# Patient Record
Sex: Female | Born: 1935 | Race: White | Hispanic: No | Smoking: Never smoker
Health system: Southern US, Community
[De-identification: ages and names within clinical notes are randomized; demographics above are authoritative.]

## PROBLEM LIST (undated history)

## (undated) DIAGNOSIS — D508 Other iron deficiency anemias: Secondary | ICD-10-CM

## (undated) DIAGNOSIS — G894 Chronic pain syndrome: Secondary | ICD-10-CM

## (undated) DIAGNOSIS — I10 Essential (primary) hypertension: Secondary | ICD-10-CM

## (undated) DIAGNOSIS — K219 Gastro-esophageal reflux disease without esophagitis: Secondary | ICD-10-CM

## (undated) DIAGNOSIS — E119 Type 2 diabetes mellitus without complications: Secondary | ICD-10-CM

## (undated) DIAGNOSIS — I480 Paroxysmal atrial fibrillation: Secondary | ICD-10-CM

## (undated) DIAGNOSIS — M80021A Age-related osteoporosis with current pathological fracture, right humerus, initial encounter for fracture: Secondary | ICD-10-CM

---

## 2003-03-17 ENCOUNTER — Encounter: Admission: RE | Admit: 2003-03-17 | Discharge: 2003-03-17 | Payer: Self-pay | Admitting: *Deleted

## 2016-08-12 ENCOUNTER — Inpatient Hospital Stay
Admission: EM | Admit: 2016-08-12 | Discharge: 2016-08-17 | DRG: 481 | Disposition: A | Discharge status: Skilled Nursing Facility | Payer: Medicare Other | Attending: Internal Medicine | Admitting: Internal Medicine

## 2016-08-12 ENCOUNTER — Emergency Department: Payer: Medicare Other

## 2016-08-12 ENCOUNTER — Encounter: Payer: Self-pay | Admitting: Emergency Medicine

## 2016-08-12 DIAGNOSIS — Z881 Allergy status to other antibiotic agents status: Secondary | ICD-10-CM

## 2016-08-12 DIAGNOSIS — Z794 Long term (current) use of insulin: Secondary | ICD-10-CM

## 2016-08-12 DIAGNOSIS — I9581 Postprocedural hypotension: Secondary | ICD-10-CM | POA: Diagnosis not present

## 2016-08-12 DIAGNOSIS — S7291XA Unspecified fracture of right femur, initial encounter for closed fracture: Secondary | ICD-10-CM

## 2016-08-12 DIAGNOSIS — Z9641 Presence of insulin pump (external) (internal): Secondary | ICD-10-CM | POA: Diagnosis present

## 2016-08-12 DIAGNOSIS — I480 Paroxysmal atrial fibrillation: Secondary | ICD-10-CM

## 2016-08-12 DIAGNOSIS — K219 Gastro-esophageal reflux disease without esophagitis: Secondary | ICD-10-CM | POA: Diagnosis present

## 2016-08-12 DIAGNOSIS — M80851A Other osteoporosis with current pathological fracture, right femur, initial encounter for fracture: Secondary | ICD-10-CM | POA: Diagnosis not present

## 2016-08-12 DIAGNOSIS — S728X1A Other fracture of right femur, initial encounter for closed fracture: Secondary | ICD-10-CM

## 2016-08-12 DIAGNOSIS — I482 Chronic atrial fibrillation: Secondary | ICD-10-CM | POA: Diagnosis present

## 2016-08-12 DIAGNOSIS — E861 Hypovolemia: Secondary | ICD-10-CM | POA: Diagnosis present

## 2016-08-12 DIAGNOSIS — I10 Essential (primary) hypertension: Secondary | ICD-10-CM | POA: Diagnosis present

## 2016-08-12 DIAGNOSIS — S42411A Displaced simple supracondylar fracture without intercondylar fracture of right humerus, initial encounter for closed fracture: Secondary | ICD-10-CM

## 2016-08-12 DIAGNOSIS — E119 Type 2 diabetes mellitus without complications: Secondary | ICD-10-CM

## 2016-08-12 DIAGNOSIS — Z96651 Presence of right artificial knee joint: Secondary | ICD-10-CM | POA: Diagnosis present

## 2016-08-12 DIAGNOSIS — G894 Chronic pain syndrome: Secondary | ICD-10-CM | POA: Diagnosis present

## 2016-08-12 DIAGNOSIS — Z9181 History of falling: Secondary | ICD-10-CM

## 2016-08-12 DIAGNOSIS — W19XXXA Unspecified fall, initial encounter: Secondary | ICD-10-CM

## 2016-08-12 DIAGNOSIS — Z885 Allergy status to narcotic agent status: Secondary | ICD-10-CM

## 2016-08-12 DIAGNOSIS — M80021P Age-related osteoporosis with current pathological fracture, right humerus, subsequent encounter for fracture with malunion: Secondary | ICD-10-CM | POA: Diagnosis present

## 2016-08-12 DIAGNOSIS — S72401A Unspecified fracture of lower end of right femur, initial encounter for closed fracture: Secondary | ICD-10-CM

## 2016-08-12 DIAGNOSIS — Z91048 Other nonmedicinal substance allergy status: Secondary | ICD-10-CM

## 2016-08-12 DIAGNOSIS — Z7901 Long term (current) use of anticoagulants: Secondary | ICD-10-CM

## 2016-08-12 DIAGNOSIS — W01198A Fall on same level from slipping, tripping and stumbling with subsequent striking against other object, initial encounter: Secondary | ICD-10-CM | POA: Diagnosis present

## 2016-08-12 DIAGNOSIS — D62 Acute posthemorrhagic anemia: Secondary | ICD-10-CM | POA: Diagnosis not present

## 2016-08-12 DIAGNOSIS — Z7982 Long term (current) use of aspirin: Secondary | ICD-10-CM

## 2016-08-12 DIAGNOSIS — M6281 Muscle weakness (generalized): Secondary | ICD-10-CM

## 2016-08-12 DIAGNOSIS — R000 Tachycardia, unspecified: Secondary | ICD-10-CM | POA: Diagnosis not present

## 2016-08-12 DIAGNOSIS — R2689 Other abnormalities of gait and mobility: Secondary | ICD-10-CM

## 2016-08-12 DIAGNOSIS — Z79899 Other long term (current) drug therapy: Secondary | ICD-10-CM

## 2016-08-12 DIAGNOSIS — S72351P Displaced comminuted fracture of shaft of right femur, subsequent encounter for closed fracture with malunion: Secondary | ICD-10-CM

## 2016-08-12 HISTORY — DX: Essential (primary) hypertension: I10

## 2016-08-12 HISTORY — DX: Gastro-esophageal reflux disease without esophagitis: K21.9

## 2016-08-12 HISTORY — DX: Age-related osteoporosis with current pathological fracture, right humerus, initial encounter for fracture: M80.021A

## 2016-08-12 HISTORY — DX: Chronic pain syndrome: G89.4

## 2016-08-12 HISTORY — DX: Type 2 diabetes mellitus without complications: E11.9

## 2016-08-12 HISTORY — DX: Paroxysmal atrial fibrillation: I48.0

## 2016-08-12 HISTORY — DX: Other iron deficiency anemias: D50.8

## 2016-08-12 LAB — COMPREHENSIVE METABOLIC PANEL
ALT: 19 U/L (ref 14–54)
AST: 23 U/L (ref 15–41)
Albumin: 3.9 g/dL (ref 3.5–5.0)
Alkaline Phosphatase: 99 U/L (ref 38–126)
Anion gap: 9 (ref 5–15)
BUN: 17 mg/dL (ref 6–20)
CO2: 27 mmol/L (ref 22–32)
CREATININE: 0.69 mg/dL (ref 0.44–1.00)
Calcium: 9.4 mg/dL (ref 8.9–10.3)
Chloride: 106 mmol/L (ref 101–111)
GFR calc Af Amer: >60 mL/min (ref >60)
Glucose, Bld: 106 mg/dL — ABNORMAL HIGH (ref 65–99)
POTASSIUM: 3.3 mmol/L — AB (ref 3.5–5.1)
Sodium: 142 mmol/L (ref 135–145)
Total Bilirubin: 0.9 mg/dL (ref 0.3–1.2)
Total Protein: 8.1 g/dL (ref 6.5–8.1)

## 2016-08-12 LAB — CBC WITH DIFFERENTIAL/PLATELET
BASOS ABS: 0.1 10*3/uL (ref 0–0.1)
Basophils Relative: 1 %
Eosinophils Absolute: 0.5 10*3/uL (ref 0–0.7)
Eosinophils Relative: 5 %
HEMATOCRIT: 33.2 % — AB (ref 35.0–47.0)
Hemoglobin: 11.8 g/dL — ABNORMAL LOW (ref 12.0–16.0)
LYMPHS PCT: 15 %
Lymphs Abs: 1.5 10*3/uL (ref 1.0–3.6)
MCH: 32.5 pg (ref 26.0–34.0)
MCHC: 35.4 g/dL (ref 32.0–36.0)
MCV: 91.9 fL (ref 80.0–100.0)
MONO ABS: 0.7 10*3/uL (ref 0.2–0.9)
MONOS PCT: 7 %
NEUTROS ABS: 7.5 10*3/uL — AB (ref 1.4–6.5)
Neutrophils Relative %: 72 %
Platelets: 392 10*3/uL (ref 150–440)
RBC: 3.62 MIL/uL — ABNORMAL LOW (ref 3.80–5.20)
RDW: 14.7 % — AB (ref 11.5–14.5)
WBC: 10.3 10*3/uL (ref 3.6–11.0)

## 2016-08-12 LAB — GLUCOSE, CAPILLARY
GLUCOSE-CAPILLARY: 127 mg/dL — AB (ref 65–99)
GLUCOSE-CAPILLARY: 248 mg/dL — AB (ref 65–99)

## 2016-08-12 LAB — PREPARE RBC (CROSSMATCH)

## 2016-08-12 LAB — TROPONIN I: Troponin I: <0.03 ng/mL (ref <0.03)

## 2016-08-12 LAB — TSH: TSH: 0.828 u[IU]/mL (ref 0.350–4.500)

## 2016-08-12 LAB — MRSA PCR SCREENING: MRSA by PCR: NEGATIVE

## 2016-08-12 LAB — ABO/RH: ABO/RH(D): A POS

## 2016-08-12 MED ORDER — SERTRALINE HCL 100 MG PO TABS
100.0000 mg | ORAL_TABLET | Freq: Every day | ORAL | Status: DC
  Administered 2016-08-12: 100 mg via ORAL
  Filled 2016-08-12: qty 1

## 2016-08-12 MED ORDER — ONDANSETRON HCL 4 MG/2ML IJ SOLN: 4.0000 mg | Freq: Four times a day (QID) | INTRAMUSCULAR | Status: DC | PRN

## 2016-08-12 MED ORDER — VITAMIN C 500 MG PO TABS
500.0000 mg | ORAL_TABLET | Freq: Every day | ORAL | Status: DC
  Administered 2016-08-12: 500 mg via ORAL
  Filled 2016-08-12: qty 1

## 2016-08-12 MED ORDER — DULAGLUTIDE 1.5 MG/0.5ML ~~LOC~~ SOAJ: 1.5000 mg | Freq: Every day | SUBCUTANEOUS | Status: DC

## 2016-08-12 MED ORDER — SODIUM CHLORIDE 0.45 % IV SOLN
INTRAVENOUS | Status: DC
  Filled 2016-08-12 (×3): qty 1000

## 2016-08-12 MED ORDER — BISACODYL 10 MG RE SUPP: 10.0000 mg | Freq: Every day | RECTAL | Status: DC | PRN

## 2016-08-12 MED ORDER — PANTOPRAZOLE SODIUM 40 MG PO TBEC
40.0000 mg | DELAYED_RELEASE_TABLET | Freq: Every day | ORAL | Status: DC
  Administered 2016-08-12: 40 mg via ORAL
  Filled 2016-08-12: qty 1

## 2016-08-12 MED ORDER — ADULT MULTIVITAMIN W/MINERALS CH
1.0000 | ORAL_TABLET | Freq: Every day | ORAL | Status: DC
Start: 2016-08-12 — End: 2016-08-13

## 2016-08-12 MED ORDER — FERROUS SULFATE 325 (65 FE) MG PO TABS
325.0000 mg | ORAL_TABLET | Freq: Every day | ORAL | Status: DC
  Administered 2016-08-12: 325 mg via ORAL
  Filled 2016-08-12: qty 1

## 2016-08-12 MED ORDER — NITROFURANTOIN MONOHYD MACRO 100 MG PO CAPS
100.0000 mg | ORAL_CAPSULE | Freq: Two times a day (BID) | ORAL | Status: DC
  Administered 2016-08-12: 100 mg via ORAL
  Filled 2016-08-12: qty 1

## 2016-08-12 MED ORDER — SOTALOL HCL 80 MG PO TABS
160.0000 mg | ORAL_TABLET | Freq: Two times a day (BID) | ORAL | Status: DC
  Administered 2016-08-12 – 2016-08-13 (×2): 160 mg via ORAL
  Filled 2016-08-12 (×2): qty 2

## 2016-08-12 MED ORDER — SENNA 8.6 MG PO TABS: 17.2000 mg | ORAL_TABLET | Freq: Every evening | ORAL | Status: DC | PRN

## 2016-08-12 MED ORDER — LOSARTAN POTASSIUM 25 MG PO TABS
25.0000 mg | ORAL_TABLET | Freq: Every day | ORAL | Status: DC
  Administered 2016-08-12: 25 mg via ORAL
  Filled 2016-08-12 (×2): qty 1

## 2016-08-12 MED ORDER — VITAMIN D 1000 UNITS PO TABS
1000.0000 [IU] | ORAL_TABLET | Freq: Every day | ORAL | Status: DC
  Administered 2016-08-12: 1000 [IU] via ORAL
  Filled 2016-08-12 (×2): qty 1

## 2016-08-12 MED ORDER — ASPIRIN EC 81 MG PO TBEC
81.0000 mg | DELAYED_RELEASE_TABLET | Freq: Every day | ORAL | Status: DC
  Administered 2016-08-12: 81 mg via ORAL
  Filled 2016-08-12: qty 1

## 2016-08-12 MED ORDER — DOCUSATE SODIUM 100 MG PO CAPS
100.0000 mg | ORAL_CAPSULE | Freq: Two times a day (BID) | ORAL | Status: DC
  Administered 2016-08-12: 100 mg via ORAL
  Filled 2016-08-12: qty 1

## 2016-08-12 MED ORDER — FENTANYL CITRATE (PF) 100 MCG/2ML IJ SOLN
100.0000 ug | INTRAMUSCULAR | Status: DC | PRN
  Administered 2016-08-12: 100 ug via INTRAVENOUS
  Administered 2016-08-12: 50 ug via INTRAVENOUS
  Filled 2016-08-12 (×3): qty 2

## 2016-08-12 MED ORDER — CEFAZOLIN SODIUM-DEXTROSE 2-4 GM/100ML-% IV SOLN
2.0000 g | INTRAVENOUS | Status: AC
  Administered 2016-08-13: 2 g via INTRAVENOUS
  Filled 2016-08-12: qty 100

## 2016-08-12 MED ORDER — CHLORHEXIDINE GLUCONATE CLOTH 2 % EX PADS: 6.0000 | MEDICATED_PAD | Freq: Once | CUTANEOUS | Status: DC

## 2016-08-12 MED ORDER — HYDROMORPHONE HCL 1 MG/ML IJ SOLN
1.0000 mg | Freq: Once | INTRAMUSCULAR | Status: AC
  Administered 2016-08-12: 1 mg via INTRAVENOUS
  Filled 2016-08-12: qty 1

## 2016-08-12 MED ORDER — CARBOXYMETHYLCELLULOSE SODIUM 0.5 % OP SOLN: 1.0000 [drp] | Freq: Two times a day (BID) | OPHTHALMIC | Status: DC | PRN

## 2016-08-12 MED ORDER — POLYVINYL ALCOHOL 1.4 % OP SOLN: 1.0000 [drp] | Freq: Two times a day (BID) | OPHTHALMIC | Status: DC | PRN

## 2016-08-12 MED ORDER — HEPARIN SODIUM (PORCINE) 5000 UNIT/ML IJ SOLN
5000.0000 [IU] | Freq: Three times a day (TID) | INTRAMUSCULAR | Status: DC
  Administered 2016-08-12: 5000 [IU] via SUBCUTANEOUS
  Filled 2016-08-12: qty 1

## 2016-08-12 MED ORDER — GABAPENTIN 600 MG PO TABS
600.0000 mg | ORAL_TABLET | Freq: Every day | ORAL | Status: DC
  Administered 2016-08-12: 600 mg via ORAL
  Filled 2016-08-12: qty 1

## 2016-08-12 MED ORDER — PRAVASTATIN SODIUM 20 MG PO TABS: 20.0000 mg | ORAL_TABLET | Freq: Every day | ORAL | Status: DC

## 2016-08-12 MED ORDER — VITAMIN B-12 1000 MCG PO TABS
1000.0000 ug | ORAL_TABLET | Freq: Every day | ORAL | Status: DC
  Administered 2016-08-12: 1000 ug via ORAL
  Filled 2016-08-12 (×2): qty 1

## 2016-08-12 MED ORDER — INSULIN ASPART 100 UNIT/ML ~~LOC~~ SOLN: 0.0000 [IU] | Freq: Three times a day (TID) | SUBCUTANEOUS | Status: DC

## 2016-08-12 MED ORDER — POTASSIUM CHLORIDE 20 MEQ PO PACK: 40.0000 meq | PACK | Freq: Once | ORAL | Status: DC

## 2016-08-12 MED ORDER — CHLORHEXIDINE GLUCONATE CLOTH 2 % EX PADS
6.0000 | MEDICATED_PAD | Freq: Once | CUTANEOUS | Status: AC
  Administered 2016-08-12: 6 via TOPICAL

## 2016-08-12 MED ORDER — HYDROMORPHONE HCL 1 MG/ML IJ SOLN
1.0000 mg | INTRAMUSCULAR | Status: DC | PRN
  Administered 2016-08-12 – 2016-08-13 (×2): 1 mg via INTRAVENOUS
  Filled 2016-08-12 (×2): qty 1

## 2016-08-12 MED ORDER — CLINDAMYCIN PHOSPHATE 900 MG/50ML IV SOLN
900.0000 mg | Freq: Once | INTRAVENOUS | Status: AC
  Administered 2016-08-13: 900 mg via INTRAVENOUS
  Filled 2016-08-12 (×2): qty 50

## 2016-08-12 MED ORDER — INSULIN PUMP
Freq: Three times a day (TID) | SUBCUTANEOUS | Status: DC
  Administered 2016-08-12 – 2016-08-13 (×2): via SUBCUTANEOUS
  Filled 2016-08-12: qty 1

## 2016-08-12 MED ORDER — SODIUM CHLORIDE 0.9% FLUSH: 3.0000 mL | Freq: Two times a day (BID) | INTRAVENOUS | Status: DC

## 2016-08-12 MED ORDER — ACETAMINOPHEN 650 MG RE SUPP: 650.0000 mg | Freq: Four times a day (QID) | RECTAL | Status: DC | PRN

## 2016-08-12 MED ORDER — ONDANSETRON HCL 4 MG PO TABS: 4.0000 mg | ORAL_TABLET | Freq: Four times a day (QID) | ORAL | Status: DC | PRN

## 2016-08-12 MED ORDER — HYDROMORPHONE HCL 1 MG/ML IJ SOLN
0.5000 mg | Freq: Four times a day (QID) | INTRAMUSCULAR | Status: DC | PRN
  Administered 2016-08-12: 0.5 mg via INTRAVENOUS
  Filled 2016-08-12: qty 1

## 2016-08-12 MED ORDER — LIDOCAINE 5 % EX PTCH
1.0000 | MEDICATED_PATCH | Freq: Every evening | CUTANEOUS | Status: DC
  Administered 2016-08-12: 1 via TRANSDERMAL
  Filled 2016-08-12 (×2): qty 1

## 2016-08-12 MED ORDER — DILTIAZEM HCL ER COATED BEADS 120 MG PO CP24
120.0000 mg | ORAL_CAPSULE | Freq: Every day | ORAL | Status: DC
  Administered 2016-08-12: 120 mg via ORAL
  Filled 2016-08-12: qty 1

## 2016-08-12 MED ORDER — ACETAMINOPHEN 325 MG PO TABS: 650.0000 mg | ORAL_TABLET | Freq: Four times a day (QID) | ORAL | Status: DC | PRN

## 2016-08-12 NOTE — Clinical Social Work Note (Signed)
Clinical Social Work Assessment  Patient Details  Name: Angelica Lewis MRN: [id number] Date of Birth: [birth year]  Date of referral:  08/12/16               Reason for consult:  Frequent Admissions / ED Visits                Permission sought to share information with:  Family Supports, Facility Medical sales representativeContact Representative Permission granted to share information::  Yes, Verbal Permission Granted  Name::        Agency::  [facility]  Relationship::     Contact Information:  Unk LightningKaren Page ( daughter [phone]) Alferd PateeChrystal Martinek [phone] HoffSusan Rice [place]( [phone]  Housing/Transportation Living arrangements for the past 2 months:  Skilled Nursing Facility Source of Information:  Patient Patient Interpreter Needed:  None Criminal Activity/Legal Involvement Pertinent to Current Situation/Hospitalization:  No - Comment as needed Significant Relationships:  Adult Children, Church, Network engineereighbor, MetLifeCommunity Support Lives with:  Self, Facility Resident Do you feel safe going back to the place where you live?  Yes Need for family participation in patient care:  Yes (Comment)  Care giving concerns: Wants to make sure her PICA is used for her diabetes ( permission has been given)   Office managerocial Worker assessment / plan: LCSW introduced myself to patient and her entire family. She and daughter remained in room while I asked her questions. She is oriented x4 and [facility] and receives rehab ptx5 and 0Tx5 and even speech therapy because she no slurs her words. Patient during interview has very good spirits and sense of humour. She will require full support with ADLs and is expecting a full recovery in for surgery tomorrow. Her vision and speech is good but she is Hard of Hearing. Patient has excellent family support and will return to [facility]. Insurance is UHC/Medicare. Experiencing some incontinence issues due to UTI- before shoulder and leg mishap she was independent.  LCSW wished her well and hopes for speedy recovery.  Employment status:  Retired Health and safety inspectornsurance information:  Marine scientistMedicare (UHC/ state employee) PT Recommendations:  Skilled Nursing Facility Information / Referral to community resources:  Skilled Nursing Facility  Patient/Family's Response to care: Very grateful but concerned with uti  Patient/Family's Understanding of and Emotional Response to Diagnosis, Current Treatment, and Prognosis:  [facility]- not pleased that EKG was done in room with close female family friend. It was embarrassing for patient and him. LCSW  Did encourage patient to comment on her patient survey. She did report she stated he was family to the EKG tech. No further concerns after this was mentioned.  Emotional Assessment Appearance:  Appears stated age Attitude/Demeanor/Rapport:   (Polite, humerous ,calm) Affect (typically observed):  Accepting, Adaptable, Anxious Orientation:  Oriented to Self, Oriented to Place, Oriented to  Time, Oriented to Situation, Fluctuating Orientation (Suspected and/or reported Sundowners) Alcohol / Substance use:  Never Used Psych involvement (Current and /or in the community):  No (Comment)  Discharge Needs  Concerns to be addressed:  Care Coordination Readmission within the last 30 days:  No Current discharge risk:  Dependent with Mobility Barriers to Discharge:  No Barriers Identified   Cheron SchaumannBandi, Jermall Isaacson M, LCSW 08/12/2016, 5:06 PM

## 2016-08-12 NOTE — Progress Notes (Signed)
Pt. Still in severe pain after receiving 0.5mg  dilaudid. Dr. Juliene PinaMody ordered dilaudid 1mg  now and dilaudid 1mg  q4h prn

## 2016-08-12 NOTE — ED Notes (Signed)
Patient verbalizes understanding of getting Fentanyl for pain control. Patient has had two doses prior without any adverse reaction.

## 2016-08-12 NOTE — Progress Notes (Signed)
Patient was admitted to room 137 from ER via cart and ER staff. Slide from er cart to bed with assist x5. Incont on admit. Bruising throughout from this fracture and new fracture. Sling to right arm. Family at bedside. IV in LAC. Insulin pump in place from home.

## 2016-08-12 NOTE — NC FL2 (Signed)
[place] MEDICAID FL2 LEVEL OF CARE SCREENING TOOL     IDENTIFICATION  Patient Name: Angelica Lewis Birthdate: 02/10/1933 Sex: female Admission Date (Current Location): 08/12/2016  [place]:  Chiropodist and Address:  [facility], [address] [place], [place] [address]      Provider Number: [id number]  Attending Physician Name and Address:  Marguarite Arbour, MD  Relative Name and Phone Number:       Current Level of Care: Hospital Recommended Level of Care: Skilled Nursing Facility Prior Approval Number:    Date Approved/Denied:   PASRR Number:    Discharge Plan: SNF    Current Diagnoses: Patient Active Problem List   Diagnosis Date Noted  . Closed disp comminuted fracture of shaft of right femur with malunion 08/12/2016  . Right supracondylar humerus fracture 08/12/2016  . PAF (paroxysmal atrial fibrillation) (HCC) 08/12/2016  . Diabetes mellitus without complication (HCC) 08/12/2016  . Femur fracture, right (HCC) 08/12/2016    Orientation RESPIRATION BLADDER Height & Weight     Self, Time, Situation, Place  Normal Incontinent Weight: 181 lb (82.1 kg) Height:  5\' 4"  (162.6 cm)  BEHAVIORAL SYMPTOMS/MOOD NEUROLOGICAL BOWEL NUTRITION STATUS      Continent Diet (Diabetic)  AMBULATORY STATUS COMMUNICATION OF NEEDS Skin   Extensive Assist Verbally Normal                       Personal Care Assistance Level of Assistance  Bathing, Feeding, Dressing, Total care Bathing Assistance: Maximum assistance Feeding assistance: Limited assistance Dressing Assistance: Maximum assistance Total Care Assistance: Maximum assistance   Functional Limitations Info  Sight, Hearing, Speech Sight Info: Adequate Hearing Info: Impaired (Hard of hearing) Speech Info: Adequate    SPECIAL CARE FACTORS FREQUENCY  PT (By licensed PT), OT (By licensed OT), Speech therapy     PT Frequency: x5 OT Frequency: x5     Speech  Therapy Frequency: x3      Contractures Contractures Info: Not present    Additional Factors Info  Code Status, Allergies Code Status Info: Full code Allergies Info: Fentanyl, Morphine, Ace Inhibitors, Ciprofloxacin, Codeine Sulfate, Doxycycline, Levofloxacin, Morphine Sulfate, Oxybenzone, Oxycodone, Oxycontin Oxycodone Hcl, Penicillins, Tape           Current Medications (08/12/2016):  This is the current hospital active medication list Current Facility-Administered Medications  Medication Dose Route Frequency Provider Last Rate Last Dose  . fentaNYL (SUBLIMAZE) injection 100 mcg  100 mcg Intravenous Q1H PRN Willy Eddy, MD   100 mcg at 08/12/16 1658  . insulin aspart (novoLOG) injection 0-9 Units  0-9 Units Subcutaneous TID WC Marguarite Arbour, MD      . sodium chloride 0.45 % 1,000 mL with potassium chloride 40 mEq infusion   Intravenous Continuous Marguarite Arbour, MD       Current Outpatient Prescriptions  Medication Sig Dispense Refill  . acetaminophen (TYLENOL) 325 MG tablet Take 325 mg by mouth every 6 (six) hours as needed for moderate pain. *Chronic pain*    . apixaban (ELIQUIS) 2.5 MG TABS tablet Take 2.5 mg by mouth 2 (two) times daily. *Hold this medication for 48 hours starting today 08/12/16 per MD*    . aspirin 81 MG chewable tablet Chew 81 mg by mouth daily.    . carboxymethylcellulose (REFRESH PLUS) 0.5 % SOLN Place 1 drop into both eyes 4 (four) times daily as needed.    . Cholecalciferol (VITAMIN D-3) 1000 units CAPS  Take 1,000 Units by mouth daily.    Marland Kitchen. co-enzyme Q-10 30 MG capsule Take 30 mg by mouth daily.    . Cyanocobalamin 1000 MCG TBCR Take 1,000 mcg by mouth daily.    Marland Kitchen. diltiazem (CARDIZEM CD) 120 MG 24 hr capsule Take 120 mg by mouth daily.    . Dulaglutide (TRULICITY) 1.5 MG/0.5ML SOPN Inject 1.5 mg into the skin daily.    . ferrous sulfate 325 (65 FE) MG tablet Take 325 mg by mouth daily.    Marland Kitchen. gabapentin (NEURONTIN) 300 MG capsule Take 600 mg by  mouth at bedtime.    . hydrocortisone cream 1 % Apply 1 application topically 2 (two) times daily.    . insulin lispro (HUMALOG) 100 UNIT/ML injection Inject into the skin See admin instructions. Use as directed before meals and at bedtime per sliding scale. BS: less than 60= Call MD              151-200= 2 units,              201-250= 4 units,              251-300= 6 units,              301-350= 8 units,              351-400= 10 units, Greater than 400= 12 units and Call MD.    . lidocaine (LIDODERM) 5 % Place 1 patch onto the skin daily. Remove & Discard patch within 12 hours or as directed by MD    . losartan (COZAAR) 25 MG tablet Take 25 mg by mouth daily.    Marland Kitchen. lovastatin (MEVACOR) 20 MG tablet Take 20 mg by mouth daily.    . Multiple Vitamin (MULTIVITAMIN) tablet Take 1 tablet by mouth daily.    . nitrofurantoin, macrocrystal-monohydrate, (MACROBID) 100 MG capsule Take 100 mg by mouth every 12 (twelve) hours.    Marland Kitchen. omeprazole (PRILOSEC) 20 MG capsule Take 20 mg by mouth daily.    Marland Kitchen. POLYETHYLENE GLYCOL 3350 PO Take 17 g by mouth daily as needed. For constipation. *Mix in 4-8 oz. of fluid*    . promethazine (PHENERGAN) 12.5 MG tablet Take 12.5 mg by mouth every 6 (six) hours as needed for nausea.    Marland Kitchen. senna (SENOKOT) 8.6 MG TABS tablet Take 17.2 mg by mouth at bedtime as needed for mild constipation or moderate constipation.    . sertraline (ZOLOFT) 100 MG tablet Take 100 mg by mouth daily.    . sotalol (BETAPACE) 160 MG tablet Take 160 mg by mouth 2 (two) times daily.    . vitamin C (ASCORBIC ACID) 500 MG tablet Take 500 mg by mouth daily.       Discharge Medications: Please see discharge summary for a list of discharge medications.  Relevant Imaging Results:  Relevant Lab Results:   Additional Information SSN # [id number]  Cheron SchaumannBandi, Dante Roudebush M, [place]

## 2016-08-12 NOTE — ED Triage Notes (Signed)
[facility] and R knee pain. Pt states she slipped on the floor in the bathroom and landed on R knee. Old bruising noted to knee. Pt has hx R arm fracture, supposed to f/u with surgeon 08/16/16. Denies LOC, denies dizziness/lightheadedness. Taking ASA 81mg . No other complaints at this time.

## 2016-08-12 NOTE — ED Notes (Signed)
Patient transported to CT 

## 2016-08-12 NOTE — Care Management Obs Status (Signed)
MEDICARE OBSERVATION STATUS NOTIFICATION   Patient Details  Name: Angelica FlavorsMartha Piatek MRN: [id number] Date of Birth: 12/17/1932   Medicare Observation Status Notification Given:  Yes    Caren MacadamMichelle Yarima Penman, RN 08/12/2016, 4:16 PM

## 2016-08-12 NOTE — Consult Note (Signed)
ORTHOPAEDIC CONSULTATION  REQUESTING PHYSICIAN: Willy Eddy, MD  Chief Complaint: Right leg and right arm pain  HPI: Angelica Lewis is a 82 y.o. female who complains of  right leg pain and right arm pain.  She fell today at [facility] going to the bathroom.  She injured her right leg just above the knee and was unable to walk.  In addition, she fell a week ago at the [facility] complex and fractured her right humerus.  She was placed in a sling and sent to [facility] for rehabilitation.  She is brought to the [facility] ER today for exam and x-rays.  A comminuted right distal femur fracture above her total knee which was done by Dr. Deneise Lever.  The prosthesis is not involved in the fracture.  She is osteopenic.  X-rays of the right humerus reveal a comminuted upper right humerus shaft fracture with minimal angulation and no significant displacement.  I have discussed the injuries with the patient and her daughter.  I feel that she needs to undergo open reduction internal fixation of the distal right femur fracture to provide stability and healing potential.  Risks and benefits and postop protocols were discussed with the patient and her daughter.  Patient has been on Eliquis last dose yesterday.  This reason, we will delay surgery until tomorrow.  We will place.  The right arm in a shoulder immobilizer at this time.  If necessary a humeral rod may be placed at a later date to facilitate rehabilitation.  Past Medical History:  Diagnosis Date  . Age-related osteoporosis with current pathological fracture of right humerus   . Chronic pain syndrome   . Diabetes mellitus without complication (HCC)   . GERD (gastroesophageal reflux disease)   . Hypertension   . Other iron deficiency anemias   . Paroxysmal a-fib (HCC)    History reviewed. No pertinent surgical history. Social History   Social History  . Marital status: Divorced    Spouse name: N/A   . Number of children: N/A  . Years of education: N/A   Social History Main Topics  . Smoking status: Never Smoker  . Smokeless tobacco: Never Used  . Alcohol use No  . Drug use: No  . Sexual activity: Not Asked   Other Topics Concern  . None   Social History Narrative  . None   History reviewed. No pertinent family history. Allergies  Allergen Reactions  . Ace Inhibitors   . Codeine Sulfate   . Morphine Sulfate   . Oxybenzone   . Oxycodone   . Oxycontin [Oxycodone Hcl]   . Penicillins    Prior to Admission medications   Not on File   Dg Chest 1 View  Result Date: 08/12/2016 CLINICAL DATA:  Pt presents to ED via EMS from [facility] c/o fall and right knee pain. Pt states she slipped on the floor in the bathroom and landed on right knee. Pt has hx R humerus fracture x 1 week, pt supposed to f/u with surgeon 08/16/16 at [facility]. All previous xrays obtained at [facility]. EXAM: CHEST 1 VIEW COMPARISON:  None. FINDINGS: Cardiac silhouette is normal in size. No mediastinal or hilar masses or evidence of adenopathy. Clear lungs.  No pleural effusion or pneumothorax. Proximal right humeral fracture is incompletely imaged. Bones are demineralized. No other fractures. IMPRESSION: No acute cardiopulmonary disease. Electronically Signed   By: Amie Portland M.D.   On: 08/12/2016 09:40   Ct Head Wo  Contrast  Result Date: 08/12/2016 CLINICAL DATA:  [facility] and R knee pain. Pt states she slipped on the floor in the bathroom and landed on R knee. Pt denies LOC but states she did hit her head. EXAM: CT HEAD WITHOUT CONTRAST TECHNIQUE: Contiguous axial images were obtained from the base of the skull through the vertex without intravenous contrast. COMPARISON:  None. FINDINGS: Brain: The ventricles are normal in configuration. There is ventricular and sulcal enlargement reflecting mild to moderate diffuse atrophy. No hydrocephalus. There are no parenchymal  masses or mass effect. There is no evidence of a cortical infarct. Patchy white matter hypoattenuation is noted consistent with moderate chronic microvascular ischemic change. There are no extra-axial masses or abnormal fluid collections. There is no intracranial hemorrhage. Vascular: No hyperdense vessel or unexpected calcification. Skull: Normal. Negative for fracture or focal lesion. Sinuses/Orbits: No acute finding. Other: None. IMPRESSION: 1. No acute intracranial abnormalities. No skull fracture or intracranial hemorrhage. 2. Atrophy and chronic microvascular ischemic change. Electronically Signed   By: Amie Portland M.D.   On: 08/12/2016 09:06   Dg Humerus Right  Result Date: 08/12/2016 CLINICAL DATA:  Pt presents to ED via EMS from [facility] c/o fall and right knee pain. Pt states she slipped on the floor in the bathroom and landed on right knee. Pt has hx R humerus fracture x 1 week, pt supposed to f/u with surgeon 08/16/16 at [facility]. All previous xrays obtained at [facility]. EXAM: RIGHT HUMERUS - 2+ VIEW COMPARISON:  None. FINDINGS: There is a comminuted fracture of the right humerus. An oblique to spiral fracture component extends from the midshaft to the proximal shaft with a more comminuted fracture of the metaphysis. There is less than 1 cm fracture displacement. There is mild apex anteromedial angulation. Bones are demineralized. Shoulder and elbow joints are normally aligned. IMPRESSION: Comminuted fracture of the proximal right humerus as described. No dislocation. Electronically Signed   By: Amie Portland M.D.   On: 08/12/2016 09:39   Dg Femur Min 2 Views Right  Result Date: 08/12/2016 CLINICAL DATA:  Pt presents to ED via EMS from [facility] c/o fall and right knee pain. Pt states she slipped on the floor in the bathroom and landed on right knee. Pt has hx R humerus fracture x 1 week, pt supposed to f/u with surgeon 08/16/16 at [facility]. All previous xrays obtained at [facility]. EXAM: RIGHT FEMUR 2  VIEWS COMPARISON:  None. FINDINGS: There is a comminuted fracture of the distal femur, extending from the posterior distal diaphysis across metaphysis. The primary distal fracture component is displaced posteriorly by approximately 2.4 cm. There is also angulation between the proximal and distal components with the distal component angulated posteriorly by 45 degrees. The knee prosthetic components are well-seated and normally aligned. Hip joint is normally spaced and aligned. Bones are demineralized. IMPRESSION: Comminuted, displaced and angulated fracture of the distal right femur above the right knee femoral prosthetic component. No dislocation. Electronically Signed   By: Amie Portland M.D.   On: 08/12/2016 09:42    Positive ROS: All other systems have been reviewed and were otherwise negative with the exception of those mentioned in the HPI and as above.  Physical Exam: General: Alert, no acute distress Cardiovascular: No pedal edema Respiratory: No cyanosis, no use of accessory musculature GI: No organomegaly, abdomen is soft and non-tender Skin: No lesions in the area of chief complaint Neurologic: Sensation intact distally Psychiatric:  Patient is competent for consent with normal mood and affect Lymphatic: No axillary or cervical lymphadenopathy  MUSCULOSKELETAL: Patient is alert and cooperative.  The right leg is shortened and rotated.  There is bruising over both patellae.  There is swelling of the distal femur.  The skin is intact.  Neurovascular status is good.  The right arm is swollen and ecchymotic.  Neurovascular status is intact distally.  Skin is intact.  There is pain to palpation over the upper humerus.  Assessment: 1.  Comminuted right distal femur fracture 2.  Comminuted proximal right humerus fracture 3.  Anticoagulation with Eliquis 4.  Diabetes mellitus  Plan: As discussed with the patient and her daughter, we will plan on doing a plate fixation of the right distal  femur tomorrow.  Risks and benefits of an discussed with them.  We will restart her Eliquis postoperatively.  We will plan on nonoperative treatment of the humerus, but may have to change that decision later.    Valinda HoarMILLER,Linder Prajapati E, MD [phone]   08/12/2016 12:26 PM

## 2016-08-12 NOTE — ED Provider Notes (Signed)
[facility] Emergency Department Provider Note    First MD Initiated Contact with Patient 08/12/16 [phone]     (approximate)  I have reviewed the triage vital signs and the nursing notes.   HISTORY  Chief Complaint Fall    HPI Angelica Lewis is a 82 y.o. female presents from nursing facility after mechanical fall from standing onto right knee and hitting head. Patient states that she is getting up for breakfast otherwise feeling well and had to go to the bathroom. Which she lost control of her bladder before getting the restroom and slipped on the wet floor landing on her right knee and falling onto her right shoulder and hitting her head against the floor. There is no loss of consciousness. Currently has a mild headache. Had obvious deformity to the right knee. She has a history of knee replacement on that side. Also has reported a proximal humerus fracture that she is scheduled for outpatient follow-up with orthopedics in October. Denies any chest pain, shortness of breath, palpitations, dizziness, abdominal pain, dysuria or fevers.   Past Medical History:  Diagnosis Date  . Age-related osteoporosis with current pathological fracture of right humerus   . Chronic pain syndrome   . Diabetes mellitus without complication (HCC)   . GERD (gastroesophageal reflux disease)   . Hypertension   . Other iron deficiency anemias   . Paroxysmal a-fib [facility])     Patient Active Problem List   Diagnosis Date Noted  . Closed disp comminuted fracture of shaft of right femur with malunion 08/12/2016  . Right supracondylar humerus fracture 08/12/2016  . PAF (paroxysmal atrial fibrillation) (HCC) 08/12/2016  . Diabetes mellitus without complication (HCC) 08/12/2016  . Femur fracture, right (HCC) 08/12/2016    History reviewed. No pertinent surgical history.  Prior to Admission medications   Medication Sig Start Date End Date Taking? Authorizing Provider  acetaminophen  (TYLENOL) 325 MG tablet Take 325 mg by mouth every 6 (six) hours as needed for moderate pain. *Chronic pain*   Yes Historical Provider, MD  apixaban (ELIQUIS) 2.5 MG TABS tablet Take 2.5 mg by mouth 2 (two) times daily. *Hold this medication for 48 hours starting today 08/12/16 per MD*   Yes Historical Provider, MD  aspirin 81 MG chewable tablet Chew 81 mg by mouth daily.   Yes Historical Provider, MD  carboxymethylcellulose (REFRESH PLUS) 0.5 % SOLN Place 1 drop into both eyes 4 (four) times daily as needed.   Yes Historical Provider, MD  Cholecalciferol (VITAMIN D-3) 1000 units CAPS Take 1,000 Units by mouth daily.   Yes Historical Provider, MD  co-enzyme Q-10 30 MG capsule Take 30 mg by mouth daily.   Yes Historical Provider, MD  Cyanocobalamin 1000 MCG TBCR Take 1,000 mcg by mouth daily.   Yes Historical Provider, MD  diltiazem (CARDIZEM CD) 120 MG 24 hr capsule Take 120 mg by mouth daily.   Yes Historical Provider, MD  Dulaglutide (TRULICITY) 1.5 MG/0.5ML SOPN Inject 1.5 mg into the skin daily.   Yes Historical Provider, MD  ferrous sulfate 325 (65 FE) MG tablet Take 325 mg by mouth daily.   Yes Historical Provider, MD  gabapentin (NEURONTIN) 300 MG capsule Take 600 mg by mouth at bedtime.   Yes Historical Provider, MD  hydrocortisone cream 1 % Apply 1 application topically 2 (two) times daily.   Yes Historical Provider, MD  insulin lispro (HUMALOG) 100 UNIT/ML injection Inject into the skin See admin instructions. Use as directed before meals and  at bedtime per sliding scale. BS: less than 60= Call MD              151-200= 2 units,              201-250= 4 units,              251-300= 6 units,              301-350= 8 units,              351-400= 10 units, Greater than 400= 12 units and Call MD.   Yes Historical Provider, MD  lidocaine (LIDODERM) 5 % Place 1 patch onto the skin daily. Remove & Discard patch within 12 hours or as directed by MD   Yes Historical Provider, MD  losartan (COZAAR)  25 MG tablet Take 25 mg by mouth daily.   Yes Historical Provider, MD  lovastatin (MEVACOR) 20 MG tablet Take 20 mg by mouth daily.   Yes Historical Provider, MD  Multiple Vitamin (MULTIVITAMIN) tablet Take 1 tablet by mouth daily.   Yes Historical Provider, MD  nitrofurantoin, macrocrystal-monohydrate, (MACROBID) 100 MG capsule Take 100 mg by mouth every 12 (twelve) hours. 08/09/16 08/13/16 Yes Historical Provider, MD  omeprazole (PRILOSEC) 20 MG capsule Take 20 mg by mouth daily.   Yes Historical Provider, MD  POLYETHYLENE GLYCOL 3350 PO Take 17 g by mouth daily as needed. For constipation. *Mix in 4-8 oz. of fluid*   Yes Historical Provider, MD  promethazine (PHENERGAN) 12.5 MG tablet Take 12.5 mg by mouth every 6 (six) hours as needed for nausea.   Yes Historical Provider, MD  senna (SENOKOT) 8.6 MG TABS tablet Take 17.2 mg by mouth at bedtime as needed for mild constipation or moderate constipation.   Yes Historical Provider, MD  sertraline (ZOLOFT) 100 MG tablet Take 100 mg by mouth daily.   Yes Historical Provider, MD  sotalol (BETAPACE) 160 MG tablet Take 160 mg by mouth 2 (two) times daily.   Yes Historical Provider, MD  vitamin C (ASCORBIC ACID) 500 MG tablet Take 500 mg by mouth daily.   Yes Historical Provider, MD    Allergies Fentanyl; Morphine; Ace inhibitors; Ciprofloxacin; Codeine sulfate; Doxycycline; Levofloxacin; Morphine sulfate; Oxybenzone; Oxycodone; Oxycontin [oxycodone hcl]; Penicillins; and Tape  History reviewed. No pertinent family history.  Social History Social History  Substance Use Topics  . Smoking status: Never Smoker  . Smokeless tobacco: Never Used  . Alcohol use No    Review of Systems Patient denies headaches, rhinorrhea, blurry vision, numbness, shortness of breath, chest pain, edema, cough, abdominal pain, nausea, vomiting, diarrhea, dysuria, fevers, rashes or hallucinations unless otherwise stated above in  HPI. ____________________________________________   PHYSICAL EXAM:  VITAL SIGNS: Vitals:   08/12/16 1400 08/12/16 1451  BP: (!) 143/67 113/73  Pulse: (!) 118 (!) 117  Resp: 16 14  Temp:      Constitutional: Alert and oriented. Chronically ill-appearing obese elderly female Eyes: Conjunctivae are normal. PERRL. EOMI. Head: Atraumatic. Nose: No congestion/rhinnorhea. Mouth/Throat: Mucous membranes are moist.  Oropharynx non-erythematous. Neck: No stridor. Painless ROM. No cervical spine tenderness to palpation Hematological/Lymphatic/Immunilogical: No cervical lymphadenopathy. Cardiovascular: Normal rate, regular rhythm. Grossly normal heart sounds.  Good peripheral circulation. Respiratory: Normal respiratory effort.  No retractions. Lungs CTAB. Gastrointestinal: Soft and nontender. No distention. No abdominal bruits. No CVA tenderness. Genitourinary:  Musculoskeletal: Right knee with patellar ecchymosis as well as large proximal knee deformity swelling and tenderness to palpation. Distally she has good PT  and DP pulses with brisk cap refill. Sensation is intact to light touch. Unable to move her right knee due to pain. Neurologic:  Normal speech and language. No gross focal neurologic deficits are appreciated. No gait instability. Skin:  Skin is warm, dry and intact. No rash noted. Psychiatric: Mood and affect are normal. Speech and behavior are normal.  ____________________________________________   LABS (all labs ordered are listed, but only abnormal results are displayed)  Results for orders placed or performed during the hospital encounter of 08/12/16 (from the past 24 hour(s))  CBC with Differential/Platelet     Status: Abnormal   Collection Time: 08/12/16  9:50 AM  Result Value Ref Range   WBC 10.3 3.6 - 11.0 K/uL   RBC 3.62 (L) 3.80 - 5.20 MIL/uL   Hemoglobin 11.8 (L) 12.0 - 16.0 g/dL   HCT 16.1 (L) 09.6 - 04.5 %   MCV 91.9 80.0 - 100.0 fL   MCH 32.5 26.0 - 34.0 pg    MCHC 35.4 32.0 - 36.0 g/dL   RDW 40.9 (H) 81.1 - 91.4 %   Platelets 392 150 - 440 K/uL   Neutrophils Relative % 72 %   Neutro Abs 7.5 (H) 1.4 - 6.5 K/uL   Lymphocytes Relative 15 %   Lymphs Abs 1.5 1.0 - 3.6 K/uL   Monocytes Relative 7 %   Monocytes Absolute 0.7 0.2 - 0.9 K/uL   Eosinophils Relative 5 %   Eosinophils Absolute 0.5 0 - 0.7 K/uL   Basophils Relative 1 %   Basophils Absolute 0.1 0 - 0.1 K/uL  Comprehensive metabolic panel     Status: Abnormal   Collection Time: 08/12/16  9:50 AM  Result Value Ref Range   Sodium 142 135 - 145 mmol/L   Potassium 3.3 (L) 3.5 - 5.1 mmol/L   Chloride 106 101 - 111 mmol/L   CO2 27 22 - 32 mmol/L   Glucose, Bld 106 (H) 65 - 99 mg/dL   BUN 17 6 - 20 mg/dL   Creatinine, Ser 7.82 0.44 - 1.00 mg/dL   Calcium 9.4 8.9 - 95.6 mg/dL   Total Protein 8.1 6.5 - 8.1 g/dL   Albumin 3.9 3.5 - 5.0 g/dL   AST 23 15 - 41 U/L   ALT 19 14 - 54 U/L   Alkaline Phosphatase 99 38 - 126 U/L   Total Bilirubin 0.9 0.3 - 1.2 mg/dL   GFR calc non Af Amer >60 >60 mL/min   GFR calc Af Amer >60 >60 mL/min   Anion gap 9 5 - 15  ABO/Rh     Status: None   Collection Time: 08/12/16  9:50 AM  Result Value Ref Range   ABO/RH(D) A POS   Type and screen     Status: None (Preliminary result)   Collection Time: 08/12/16  2:50 PM  Result Value Ref Range   ABO/RH(D) A POS    Antibody Screen NEG    Sample Expiration 08/15/2016    Unit Number [id number]    Blood Component Type RED CELLS,LR    Unit division 00    Status of Unit ALLOCATED    Transfusion Status OK TO TRANSFUSE    Crossmatch Result Compatible    Unit Number [id number]    Blood Component Type RED CELLS,LR    Unit division 00    Status of Unit ALLOCATED    Transfusion Status OK TO TRANSFUSE    Crossmatch Result Compatible   Prepare RBC     Status:  None   Collection Time: 08/12/16  2:50 PM  Result Value Ref Range   Order Confirmation ORDER PROCESSED BY BLOOD BANK     ____________________________________________  ____________________________________________  RADIOLOGY  I personally reviewed all radiographic images ordered to evaluate for the above acute complaints and reviewed radiology reports and findings.  These findings were personally discussed with the patient.  Please see medical record for radiology report.  ____________________________________________   PROCEDURES  Procedure(s) performed: none     Critical Care performed: no ____________________________________________   INITIAL IMPRESSION / ASSESSMENT AND PLAN / ED COURSE  Pertinent labs & imaging results that were available during my care of the patient were reviewed by me and considered in my medical decision making (see chart for details).  DDX: fracture, dislocation, contusion, sah. Iph, sdh  Meriel FlavorsMartha Schooley is a 82 y.o. who presents to the ED with obvious injury deformity of the right knee as well as head injury after mechanical fall. Patient otherwise afebrile and hemodynamically stable. Will order radiographic evaluation to evaluate for acute back injuries.  The patient will be placed on continuous pulse oximetry and telemetry for monitoring.  Laboratory evaluation will be sent to evaluate for the above complaints.     Clinical Course  Comment By Time  X-ray does show evidence of a distal comminuted and displaced femur fracture proximal to a total knee. Willy EddyPatrick Amala Petion, MD 09/30 0930  X-ray here shows a spiral fracture of the humerus as well as a proximal humerus fracture. Willy EddyPatrick Hagop Mccollam, MD 09/30 [phone]  Spoke to Dr. Hyacinth MeekerMiller who agrees to admit patient for operative management. Willy EddyPatrick Zacharie Portner, MD 09/30 1010     ____________________________________________   FINAL CLINICAL IMPRESSION(S) / ED DIAGNOSES  Final diagnoses:  Femur fracture, right, closed, initial encounter  Fall, initial encounter      NEW MEDICATIONS STARTED DURING THIS VISIT:  New  Prescriptions   No medications on file     Note:  This document was prepared using Dragon voice recognition software and may include unintentional dictation errors.    Willy EddyPatrick Enedina Pair, MD 08/12/16 [phone]

## 2016-08-12 NOTE — ED Notes (Signed)
Soiled brief changed at this time. 

## 2016-08-12 NOTE — H&P (Signed)
History and Physical    [name] NWG:[id number] DOB: 03/10/1933 DOA: 08/12/2016  Referring physician: Dr. Roxan Hockey PCP: Keane Police, MD  Specialists: none  Chief Complaint: fall with right leg pain  HPI: Angelica Lewis is a 82 y.o. female has a past medical history significant for PAF, DM, and HTN with recent right humeral fracture residing at [facility] who fell earlier today and presented to ER with right leg pain. Was found to have a right femur fracture. Pt is in A-fib for which she is on Eliquis. She has been seen by Ortho who plans on femur surgery tomorrow. She is now admitted. She denies CP or SOB. No syncope or near syncope,  Review of Systems: The patient denies anorexia, fever, weight loss,, vision loss, decreased hearing, hoarseness, chest pain, syncope, dyspnea on exertion, peripheral edema, balance deficits, hemoptysis, abdominal pain, melena, hematochezia, severe indigestion/heartburn, hematuria, incontinence, genital sores, muscle weakness, suspicious skin lesions, transient blindness, difficulty walking, depression, unusual weight change, abnormal bleeding, enlarged lymph nodes, angioedema, and breast masses.   Past Medical History:  Diagnosis Date  . Age-related osteoporosis with current pathological fracture of right humerus   . Chronic pain syndrome   . Diabetes mellitus without complication (HCC)   . GERD (gastroesophageal reflux disease)   . Hypertension   . Other iron deficiency anemias   . Paroxysmal a-fib (HCC)    History reviewed. No pertinent surgical history. Social History:  reports that she has never smoked. She has never used smokeless tobacco. She reports that she does not drink alcohol or use drugs.  Allergies  Allergen Reactions  . Fentanyl     Other reaction(s): Other (See Comments) Other Reaction: cardiac arrest  . Morphine     Other reaction(s): Other (See Comments) Other Reaction: respiratory distress  . Ace Inhibitors Other  (See Comments)    Unknown reaction  . Ciprofloxacin     Other reaction(s): Unknown  . Codeine Sulfate Hives  . Doxycycline     Other reaction(s): Other (See Comments) Other Reaction: skin lesions  . Levofloxacin Swelling  . Morphine Sulfate Other (See Comments)    Unknown reaction  . Oxybenzone Other (See Comments)    Unknown reaction  . Oxycodone Nausea Only  . Oxycontin [Oxycodone Hcl]   . Penicillins Other (See Comments)    Unknown reaction  . Tape     History reviewed. No pertinent family history.  Prior to Admission medications   Medication Sig Start Date End Date Taking? Authorizing Provider  acetaminophen (TYLENOL) 325 MG tablet Take 325 mg by mouth every 6 (six) hours as needed for moderate pain. *Chronic pain*   Yes Historical Provider, MD  apixaban (ELIQUIS) 2.5 MG TABS tablet Take 2.5 mg by mouth 2 (two) times daily. *Hold this medication for 48 hours starting today 08/12/16 per MD*   Yes Historical Provider, MD  aspirin 81 MG chewable tablet Chew 81 mg by mouth daily.   Yes Historical Provider, MD  carboxymethylcellulose (REFRESH PLUS) 0.5 % SOLN Place 1 drop into both eyes 4 (four) times daily as needed.   Yes Historical Provider, MD  Cholecalciferol (VITAMIN D-3) 1000 units CAPS Take 1,000 Units by mouth daily.   Yes Historical Provider, MD  co-enzyme Q-10 30 MG capsule Take 30 mg by mouth daily.   Yes Historical Provider, MD  Cyanocobalamin 1000 MCG TBCR Take 1,000 mcg by mouth daily.   Yes Historical Provider, MD  diltiazem (CARDIZEM CD) 120 MG 24 hr capsule Take 120 mg  by mouth daily.   Yes Historical Provider, MD  Dulaglutide (TRULICITY) 1.5 MG/0.5ML SOPN Inject 1.5 mg into the skin daily.   Yes Historical Provider, MD  ferrous sulfate 325 (65 FE) MG tablet Take 325 mg by mouth daily.   Yes Historical Provider, MD  gabapentin (NEURONTIN) 300 MG capsule Take 600 mg by mouth at bedtime.   Yes Historical Provider, MD  hydrocortisone cream 1 % Apply 1 application  topically 2 (two) times daily.   Yes Historical Provider, MD  insulin lispro (HUMALOG) 100 UNIT/ML injection Inject into the skin See admin instructions. Use as directed before meals and at bedtime per sliding scale. BS: less than 60= Call MD              151-200= 2 units,              201-250= 4 units,              251-300= 6 units,              301-350= 8 units,              351-400= 10 units, Greater than 400= 12 units and Call MD.   Yes Historical Provider, MD  lidocaine (LIDODERM) 5 % Place 1 patch onto the skin daily. Remove & Discard patch within 12 hours or as directed by MD   Yes Historical Provider, MD  losartan (COZAAR) 25 MG tablet Take 25 mg by mouth daily.   Yes Historical Provider, MD  lovastatin (MEVACOR) 20 MG tablet Take 20 mg by mouth daily.   Yes Historical Provider, MD  Multiple Vitamin (MULTIVITAMIN) tablet Take 1 tablet by mouth daily.   Yes Historical Provider, MD  nitrofurantoin, macrocrystal-monohydrate, (MACROBID) 100 MG capsule Take 100 mg by mouth every 12 (twelve) hours. 08/09/16 08/13/16 Yes Historical Provider, MD  omeprazole (PRILOSEC) 20 MG capsule Take 20 mg by mouth daily.   Yes Historical Provider, MD  POLYETHYLENE GLYCOL 3350 PO Take 17 g by mouth daily as needed. For constipation. *Mix in 4-8 oz. of fluid*   Yes Historical Provider, MD  promethazine (PHENERGAN) 12.5 MG tablet Take 12.5 mg by mouth every 6 (six) hours as needed for nausea.   Yes Historical Provider, MD  senna (SENOKOT) 8.6 MG TABS tablet Take 17.2 mg by mouth at bedtime as needed for mild constipation or moderate constipation.   Yes Historical Provider, MD  sertraline (ZOLOFT) 100 MG tablet Take 100 mg by mouth daily.   Yes Historical Provider, MD  sotalol (BETAPACE) 160 MG tablet Take 160 mg by mouth 2 (two) times daily.   Yes Historical Provider, MD  vitamin C (ASCORBIC ACID) 500 MG tablet Take 500 mg by mouth daily.   Yes Historical Provider, MD   Physical Exam: Vitals:   08/12/16 1300  08/12/16 1330 08/12/16 1400 08/12/16 1451  BP: (!) 144/109 109/66 (!) 143/67 113/73  Pulse: 100 (!) 125 (!) 118 (!) 117  Resp: 19 11 16 14   Temp:      TempSrc:      SpO2: 100% 99% 100% 98%  Weight:      Height:         General:  No apparent distress, WDWN, [place]/AT  Eyes: PERRL, EOMI, no scleral icterus, conjunctiva clear  ENT: moist oropharynx without exudate, TM's benign, Dentition fair  Neck: supple, no lymphadenopathy. No bruits or thyromegaly  Cardiovascular: irregularly irregular without MRG; 2+ peripheral pulses, no JVD, trace peripheral edema  Respiratory: CTA biL,  good air movement without wheezing, rhonchi or crackled. Respiratory effort normal  Abdomen: soft, non tender to palpation, positive bowel sounds, no guarding, no rebound  Skin: no rashes or lesions  Musculoskeletal: normal bulk and tone, no joint swelling  Psychiatric: normal mood and affect, A&OX3  Neurologic: CN 2-12 grossly intact, Motor strength 5/5 in all 4 groups with symmetric DTR's and non-focal sensory exam  Labs on Admission:  Basic Metabolic Panel:  Recent Labs Lab 08/12/16 0950  NA 142  K 3.3*  CL 106  CO2 27  GLUCOSE 106*  BUN 17  CREATININE 0.69  CALCIUM 9.4   Liver Function Tests:  Recent Labs Lab 08/12/16 0950  AST 23  ALT 19  ALKPHOS 99  BILITOT 0.9  PROT 8.1  ALBUMIN 3.9   No results for input(s): LIPASE, AMYLASE in the last 168 hours. No results for input(s): AMMONIA in the last 168 hours. CBC:  Recent Labs Lab 08/12/16 0950  WBC 10.3  NEUTROABS 7.5*  HGB 11.8*  HCT 33.2*  MCV 91.9  PLT 392   Cardiac Enzymes: No results for input(s): CKTOTAL, CKMB, CKMBINDEX, TROPONINI in the last 168 hours.  BNP (last 3 results) No results for input(s): BNP in the last 8760 hours.  ProBNP (last 3 results) No results for input(s): PROBNP in the last 8760 hours.  CBG: No results for input(s): GLUCAP in the last 168 hours.  Radiological Exams on Admission: Dg  Chest 1 View  Result Date: 08/12/2016 CLINICAL DATA:  Pt presents to ED via EMS from [facility] c/o fall and right knee pain. Pt states she slipped on the floor in the bathroom and landed on right knee. Pt has hx R humerus fracture x 1 week, pt supposed to f/u with surgeon 08/16/16 at [facility]. All previous xrays obtained at [facility]. EXAM: CHEST 1 VIEW COMPARISON:  None. FINDINGS: Cardiac silhouette is normal in size. No mediastinal or hilar masses or evidence of adenopathy. Clear lungs.  No pleural effusion or pneumothorax. Proximal right humeral fracture is incompletely imaged. Bones are demineralized. No other fractures. IMPRESSION: No acute cardiopulmonary disease. Electronically Signed   By: Amie Portland M.D.   On: 08/12/2016 09:40   Ct Head Wo Contrast  Result Date: 08/12/2016 CLINICAL DATA:  Pt presents to ED via EMS from [facility] c/o fall and R knee pain. Pt states she slipped on the floor in the bathroom and landed on R knee. Pt denies LOC but states she did hit her head. EXAM: CT HEAD WITHOUT CONTRAST TECHNIQUE: Contiguous axial images were obtained from the base of the skull through the vertex without intravenous contrast. COMPARISON:  None. FINDINGS: Brain: The ventricles are normal in configuration. There is ventricular and sulcal enlargement reflecting mild to moderate diffuse atrophy. No hydrocephalus. There are no parenchymal masses or mass effect. There is no evidence of a cortical infarct. Patchy white matter hypoattenuation is noted consistent with moderate chronic microvascular ischemic change. There are no extra-axial masses or abnormal fluid collections. There is no intracranial hemorrhage. Vascular: No hyperdense vessel or unexpected calcification. Skull: Normal. Negative for fracture or focal lesion. Sinuses/Orbits: No acute finding. Other: None. IMPRESSION: 1. No acute intracranial abnormalities. No skull fracture or intracranial hemorrhage. 2. Atrophy and chronic microvascular  ischemic change. Electronically Signed   By: Amie Portland M.D.   On: 08/12/2016 09:06   Dg Humerus Right  Result Date: 08/12/2016 CLINICAL DATA:  Pt presents to ED via EMS from [facility] c/o fall and right knee  pain. Pt states she slipped on the floor in the bathroom and landed on right knee. Pt has hx R humerus fracture x 1 week, pt supposed to f/u with surgeon 08/16/16 at [facility]. All previous xrays obtained at [facility]. EXAM: RIGHT HUMERUS - 2+ VIEW COMPARISON:  None. FINDINGS: There is a comminuted fracture of the right humerus. An oblique to spiral fracture component extends from the midshaft to the proximal shaft with a more comminuted fracture of the metaphysis. There is less than 1 cm fracture displacement. There is mild apex anteromedial angulation. Bones are demineralized. Shoulder and elbow joints are normally aligned. IMPRESSION: Comminuted fracture of the proximal right humerus as described. No dislocation. Electronically Signed   By: Amie Portland M.D.   On: 08/12/2016 09:39   Dg Femur Min 2 Views Right  Result Date: 08/12/2016 CLINICAL DATA:  Pt presents to ED via EMS from [facility] c/o fall and right knee pain. Pt states she slipped on the floor in the bathroom and landed on right knee. Pt has hx R humerus fracture x 1 week, pt supposed to f/u with surgeon 08/16/16 at [facility]. All previous xrays obtained at [facility]. EXAM: RIGHT FEMUR 2 VIEWS COMPARISON:  None. FINDINGS: There is a comminuted fracture of the distal femur, extending from the posterior distal diaphysis across metaphysis. The primary distal fracture component is displaced posteriorly by approximately 2.4 cm. There is also angulation between the proximal and distal components with the distal component angulated posteriorly by 45 degrees. The knee prosthetic components are well-seated and normally aligned. Hip joint is normally spaced and aligned. Bones are demineralized. IMPRESSION: Comminuted, displaced and angulated fracture of the  distal right femur above the right knee femoral prosthetic component. No dislocation. Electronically Signed   By: Amie Portland M.D.   On: 08/12/2016 09:42    EKG: Independently reviewed.  Assessment/Plan Principal Problem:   Closed disp comminuted fracture of shaft of right femur with malunion Active Problems:   Right supracondylar humerus fracture   PAF (paroxysmal atrial fibrillation) (HCC)   Diabetes mellitus without complication (HCC)   Will admit to floor with telemetry and hold Eliquis. Surgery per Ortho. Follow sugars. Order echo and Cardiology consult. Consult PT and CSW. Repeat labs in AM.  Diet: NPO after MN Fluids: 1/2 NS with K+ DVT Prophylaxis: SQ Heparin  Code Status: FULL  Family Communication: yes  Disposition Plan: SNF  Time spent: 50 min

## 2016-08-13 ENCOUNTER — Observation Stay: Payer: Medicare Other

## 2016-08-13 ENCOUNTER — Observation Stay: Admit: 2016-08-13 | Payer: Medicare Other

## 2016-08-13 ENCOUNTER — Observation Stay: Payer: Medicare Other | Admitting: Anesthesiology

## 2016-08-13 ENCOUNTER — Encounter
Admission: EM | Disposition: A | Discharge status: Skilled Nursing Facility | Payer: Self-pay | Source: Home / Self Care | Attending: Internal Medicine

## 2016-08-13 ENCOUNTER — Encounter: Payer: Self-pay | Admitting: Anesthesiology

## 2016-08-13 DIAGNOSIS — K219 Gastro-esophageal reflux disease without esophagitis: Secondary | ICD-10-CM | POA: Diagnosis present

## 2016-08-13 DIAGNOSIS — Z79899 Other long term (current) drug therapy: Secondary | ICD-10-CM | POA: Diagnosis not present

## 2016-08-13 DIAGNOSIS — Z96651 Presence of right artificial knee joint: Secondary | ICD-10-CM | POA: Diagnosis present

## 2016-08-13 DIAGNOSIS — G894 Chronic pain syndrome: Secondary | ICD-10-CM | POA: Diagnosis present

## 2016-08-13 DIAGNOSIS — Z885 Allergy status to narcotic agent status: Secondary | ICD-10-CM | POA: Diagnosis not present

## 2016-08-13 DIAGNOSIS — I480 Paroxysmal atrial fibrillation: Secondary | ICD-10-CM | POA: Diagnosis present

## 2016-08-13 DIAGNOSIS — W01198A Fall on same level from slipping, tripping and stumbling with subsequent striking against other object, initial encounter: Secondary | ICD-10-CM | POA: Diagnosis present

## 2016-08-13 DIAGNOSIS — D62 Acute posthemorrhagic anemia: Secondary | ICD-10-CM | POA: Diagnosis not present

## 2016-08-13 DIAGNOSIS — M80851A Other osteoporosis with current pathological fracture, right femur, initial encounter for fracture: Secondary | ICD-10-CM | POA: Diagnosis present

## 2016-08-13 DIAGNOSIS — Z794 Long term (current) use of insulin: Secondary | ICD-10-CM | POA: Diagnosis not present

## 2016-08-13 DIAGNOSIS — Z91048 Other nonmedicinal substance allergy status: Secondary | ICD-10-CM | POA: Diagnosis not present

## 2016-08-13 DIAGNOSIS — R000 Tachycardia, unspecified: Secondary | ICD-10-CM | POA: Diagnosis not present

## 2016-08-13 DIAGNOSIS — E119 Type 2 diabetes mellitus without complications: Secondary | ICD-10-CM | POA: Diagnosis present

## 2016-08-13 DIAGNOSIS — Z9181 History of falling: Secondary | ICD-10-CM | POA: Diagnosis not present

## 2016-08-13 DIAGNOSIS — E861 Hypovolemia: Secondary | ICD-10-CM | POA: Diagnosis present

## 2016-08-13 DIAGNOSIS — I10 Essential (primary) hypertension: Secondary | ICD-10-CM | POA: Diagnosis present

## 2016-08-13 DIAGNOSIS — M80021P Age-related osteoporosis with current pathological fracture, right humerus, subsequent encounter for fracture with malunion: Secondary | ICD-10-CM | POA: Diagnosis present

## 2016-08-13 DIAGNOSIS — Z7982 Long term (current) use of aspirin: Secondary | ICD-10-CM | POA: Diagnosis not present

## 2016-08-13 DIAGNOSIS — I9581 Postprocedural hypotension: Secondary | ICD-10-CM | POA: Diagnosis not present

## 2016-08-13 DIAGNOSIS — Z9641 Presence of insulin pump (external) (internal): Secondary | ICD-10-CM | POA: Diagnosis present

## 2016-08-13 DIAGNOSIS — Z881 Allergy status to other antibiotic agents status: Secondary | ICD-10-CM | POA: Diagnosis not present

## 2016-08-13 DIAGNOSIS — Z7901 Long term (current) use of anticoagulants: Secondary | ICD-10-CM | POA: Diagnosis not present

## 2016-08-13 DIAGNOSIS — I482 Chronic atrial fibrillation: Secondary | ICD-10-CM | POA: Diagnosis present

## 2016-08-13 HISTORY — PX: ORIF FEMUR FRACTURE: SHX2119

## 2016-08-13 LAB — COMPREHENSIVE METABOLIC PANEL
ALBUMIN: 3.1 g/dL — AB (ref 3.5–5.0)
ALK PHOS: 89 U/L (ref 38–126)
ALT: 15 U/L (ref 14–54)
AST: 22 U/L (ref 15–41)
Anion gap: 8 (ref 5–15)
BILIRUBIN TOTAL: 0.5 mg/dL (ref 0.3–1.2)
BUN: 20 mg/dL (ref 6–20)
CALCIUM: 8.4 mg/dL — AB (ref 8.9–10.3)
CO2: 26 mmol/L (ref 22–32)
CREATININE: 0.77 mg/dL (ref 0.44–1.00)
Chloride: 102 mmol/L (ref 101–111)
GFR calc Af Amer: >60 mL/min (ref >60)
GFR calc non Af Amer: >60 mL/min (ref >60)
GLUCOSE: 158 mg/dL — AB (ref 65–99)
Potassium: 3.6 mmol/L (ref 3.5–5.1)
SODIUM: 136 mmol/L (ref 135–145)
TOTAL PROTEIN: 6.7 g/dL (ref 6.5–8.1)

## 2016-08-13 LAB — CBC
HCT: 27.5 % — ABNORMAL LOW (ref 35.0–47.0)
HEMOGLOBIN: 9.6 g/dL — AB (ref 12.0–16.0)
MCH: 31.9 pg (ref 26.0–34.0)
MCHC: 35 g/dL (ref 32.0–36.0)
MCV: 91.2 fL (ref 80.0–100.0)
Platelets: 341 10*3/uL (ref 150–440)
RBC: 3.01 MIL/uL — ABNORMAL LOW (ref 3.80–5.20)
RDW: 14.7 % — AB (ref 11.5–14.5)
WBC: 9.2 10*3/uL (ref 3.6–11.0)

## 2016-08-13 LAB — GLUCOSE, CAPILLARY
GLUCOSE-CAPILLARY: 109 mg/dL — AB (ref 65–99)
Glucose-Capillary: 138 mg/dL — ABNORMAL HIGH (ref 65–99)
Glucose-Capillary: 143 mg/dL — ABNORMAL HIGH (ref 65–99)
Glucose-Capillary: 155 mg/dL — ABNORMAL HIGH (ref 65–99)

## 2016-08-13 LAB — TROPONIN I

## 2016-08-13 SURGERY — OPEN REDUCTION INTERNAL FIXATION (ORIF) DISTAL FEMUR FRACTURE
Anesthesia: General | Laterality: Right

## 2016-08-13 MED ORDER — PHENYLEPHRINE HCL 10 MG/ML IJ SOLN
INTRAMUSCULAR | Status: DC | PRN
  Administered 2016-08-13: 100 ug via INTRAVENOUS
  Administered 2016-08-13 (×2): 200 ug via INTRAVENOUS
  Administered 2016-08-13: 300 ug via INTRAVENOUS
  Administered 2016-08-13: 200 ug via INTRAVENOUS

## 2016-08-13 MED ORDER — INSULIN PUMP
Freq: Three times a day (TID) | SUBCUTANEOUS | Status: DC
  Administered 2016-08-14: 13:00:00 via SUBCUTANEOUS
  Administered 2016-08-14 – 2016-08-15 (×2): 3 via SUBCUTANEOUS
  Administered 2016-08-15: 2 via SUBCUTANEOUS
  Administered 2016-08-15: 3 via SUBCUTANEOUS
  Administered 2016-08-15 (×2): 2 via SUBCUTANEOUS
  Administered 2016-08-16: 12:00:00 via SUBCUTANEOUS
  Administered 2016-08-16: 2 via SUBCUTANEOUS
  Administered 2016-08-16 (×2): via SUBCUTANEOUS
  Administered 2016-08-16: 2 via SUBCUTANEOUS
  Administered 2016-08-17: 3 via SUBCUTANEOUS
  Administered 2016-08-17: 12:00:00 via SUBCUTANEOUS
  Filled 2016-08-13: qty 1

## 2016-08-13 MED ORDER — FENTANYL CITRATE (PF) 100 MCG/2ML IJ SOLN: 25.0000 ug | INTRAMUSCULAR | Status: DC | PRN

## 2016-08-13 MED ORDER — FENTANYL CITRATE (PF) 100 MCG/2ML IJ SOLN
INTRAMUSCULAR | Status: DC | PRN
  Administered 2016-08-13: 50 ug via INTRAVENOUS

## 2016-08-13 MED ORDER — ONDANSETRON HCL 4 MG/2ML IJ SOLN: 4.0000 mg | Freq: Four times a day (QID) | INTRAMUSCULAR | Status: DC | PRN

## 2016-08-13 MED ORDER — GLYCOPYRROLATE 0.2 MG/ML IJ SOLN
INTRAMUSCULAR | Status: DC | PRN
  Administered 2016-08-13: 0.1 mg via INTRAVENOUS

## 2016-08-13 MED ORDER — SODIUM CHLORIDE 0.45 % IV SOLN
INTRAVENOUS | Status: DC
  Administered 2016-08-13: 1000 mL via INTRAVENOUS

## 2016-08-13 MED ORDER — ACETAMINOPHEN 10 MG/ML IV SOLN
INTRAVENOUS | Status: AC
  Filled 2016-08-13: qty 100

## 2016-08-13 MED ORDER — METOCLOPRAMIDE HCL 10 MG PO TABS: 5.0000 mg | ORAL_TABLET | Freq: Three times a day (TID) | ORAL | Status: DC | PRN

## 2016-08-13 MED ORDER — APIXABAN 2.5 MG PO TABS
2.5000 mg | ORAL_TABLET | Freq: Two times a day (BID) | ORAL | Status: DC
  Administered 2016-08-13 – 2016-08-17 (×8): 2.5 mg via ORAL
  Filled 2016-08-13 (×9): qty 1

## 2016-08-13 MED ORDER — SODIUM CHLORIDE 0.9 % IV SOLN
INTRAVENOUS | Status: DC | PRN
  Administered 2016-08-13: 100 ug/min via INTRAVENOUS

## 2016-08-13 MED ORDER — CHLORHEXIDINE GLUCONATE CLOTH 2 % EX PADS
6.0000 | MEDICATED_PAD | Freq: Once | CUTANEOUS | Status: AC
  Administered 2016-08-13: 6 via TOPICAL

## 2016-08-13 MED ORDER — BUPIVACAINE-EPINEPHRINE 0.5% -1:200000 IJ SOLN
INTRAMUSCULAR | Status: DC | PRN
  Administered 2016-08-13: 30 mL

## 2016-08-13 MED ORDER — FLEET ENEMA 7-19 GM/118ML RE ENEM: 1.0000 | ENEMA | Freq: Once | RECTAL | Status: DC | PRN

## 2016-08-13 MED ORDER — ACETAMINOPHEN 325 MG PO TABS
650.0000 mg | ORAL_TABLET | Freq: Four times a day (QID) | ORAL | Status: DC | PRN
  Administered 2016-08-15: 650 mg via ORAL
  Filled 2016-08-13: qty 2

## 2016-08-13 MED ORDER — APIXABAN 2.5 MG PO TABS: 2.5000 mg | ORAL_TABLET | Freq: Two times a day (BID) | ORAL | Status: DC

## 2016-08-13 MED ORDER — CLINDAMYCIN PHOSPHATE 600 MG/50ML IV SOLN
INTRAVENOUS | Status: DC | PRN
Start: 2016-08-13 — End: 2016-08-13

## 2016-08-13 MED ORDER — METHOCARBAMOL 1000 MG/10ML IJ SOLN
500.0000 mg | Freq: Four times a day (QID) | INTRAVENOUS | Status: DC | PRN
  Filled 2016-08-13: qty 5

## 2016-08-13 MED ORDER — CLINDAMYCIN PHOSPHATE 900 MG/50ML IV SOLN
900.0000 mg | Freq: Three times a day (TID) | INTRAVENOUS | Status: AC
  Administered 2016-08-13 – 2016-08-14 (×3): 900 mg via INTRAVENOUS
  Filled 2016-08-13 (×3): qty 50

## 2016-08-13 MED ORDER — EPHEDRINE SULFATE 50 MG/ML IJ SOLN
INTRAMUSCULAR | Status: DC | PRN
  Administered 2016-08-13: 10 mg via INTRAVENOUS
  Administered 2016-08-13: 15 mg via INTRAVENOUS
  Administered 2016-08-13: 10 mg via INTRAVENOUS
  Administered 2016-08-13: 5 mg via INTRAVENOUS
  Administered 2016-08-13: 15 mg via INTRAVENOUS
  Administered 2016-08-13: 10 mg via INTRAVENOUS

## 2016-08-13 MED ORDER — HYDROCODONE-ACETAMINOPHEN 7.5-325 MG PO TABS
1.0000 | ORAL_TABLET | ORAL | Status: DC | PRN
  Administered 2016-08-15 – 2016-08-17 (×3): 1 via ORAL
  Filled 2016-08-13 (×2): qty 1
  Filled 2016-08-13: qty 2

## 2016-08-13 MED ORDER — NEOMYCIN-POLYMYXIN B GU 40-200000 IR SOLN
Status: DC | PRN
  Administered 2016-08-13: 4 mL

## 2016-08-13 MED ORDER — MAGNESIUM HYDROXIDE 400 MG/5ML PO SUSP: 30.0000 mL | Freq: Every day | ORAL | Status: DC | PRN

## 2016-08-13 MED ORDER — SODIUM CHLORIDE 0.9 % IV SOLN
Freq: Once | INTRAVENOUS | Status: AC
  Administered 2016-08-13: 16:00:00 via INTRAVENOUS

## 2016-08-13 MED ORDER — NEOMYCIN-POLYMYXIN B GU 40-200000 IR SOLN
Status: AC
  Filled 2016-08-13: qty 4

## 2016-08-13 MED ORDER — HYDROMORPHONE HCL 1 MG/ML IJ SOLN
1.0000 mg | INTRAMUSCULAR | Status: DC | PRN
  Administered 2016-08-13 – 2016-08-14 (×6): 1 mg via INTRAVENOUS
  Filled 2016-08-13 (×6): qty 1

## 2016-08-13 MED ORDER — ONDANSETRON HCL 4 MG/2ML IJ SOLN
4.0000 mg | Freq: Once | INTRAMUSCULAR | Status: DC | PRN
Start: 2016-08-13 — End: 2016-08-13

## 2016-08-13 MED ORDER — ROCURONIUM BROMIDE 100 MG/10ML IV SOLN
INTRAVENOUS | Status: DC | PRN
  Administered 2016-08-13: 30 mg via INTRAVENOUS

## 2016-08-13 MED ORDER — ACETAMINOPHEN 650 MG RE SUPP: 650.0000 mg | Freq: Four times a day (QID) | RECTAL | Status: DC | PRN

## 2016-08-13 MED ORDER — SENNA 8.6 MG PO TABS
1.0000 | ORAL_TABLET | Freq: Two times a day (BID) | ORAL | Status: DC
  Administered 2016-08-13 – 2016-08-15 (×4): 8.6 mg via ORAL
  Filled 2016-08-13 (×4): qty 1

## 2016-08-13 MED ORDER — CEFAZOLIN SODIUM-DEXTROSE 2-4 GM/100ML-% IV SOLN
2.0000 g | Freq: Four times a day (QID) | INTRAVENOUS | Status: AC
  Administered 2016-08-13 – 2016-08-14 (×2): 2 g via INTRAVENOUS
  Filled 2016-08-13 (×3): qty 100

## 2016-08-13 MED ORDER — BUPIVACAINE-EPINEPHRINE (PF) 0.5% -1:200000 IJ SOLN
INTRAMUSCULAR | Status: AC
  Filled 2016-08-13: qty 30

## 2016-08-13 MED ORDER — SODIUM CHLORIDE 0.9 % IV SOLN
INTRAVENOUS | Status: DC
  Administered 2016-08-13 – 2016-08-14 (×2): via INTRAVENOUS

## 2016-08-13 MED ORDER — ONDANSETRON HCL 4 MG PO TABS: 4.0000 mg | ORAL_TABLET | Freq: Four times a day (QID) | ORAL | Status: DC | PRN

## 2016-08-13 MED ORDER — BISACODYL 10 MG RE SUPP: 10.0000 mg | Freq: Every day | RECTAL | Status: DC | PRN

## 2016-08-13 MED ORDER — ACETAMINOPHEN 500 MG PO TABS: 1000.0000 mg | ORAL_TABLET | Freq: Four times a day (QID) | ORAL | Status: AC | PRN

## 2016-08-13 MED ORDER — CELECOXIB 200 MG PO CAPS
200.0000 mg | ORAL_CAPSULE | Freq: Two times a day (BID) | ORAL | Status: DC
  Administered 2016-08-13 – 2016-08-17 (×8): 200 mg via ORAL
  Filled 2016-08-13 (×8): qty 1

## 2016-08-13 MED ORDER — METHOCARBAMOL 500 MG PO TABS
500.0000 mg | ORAL_TABLET | Freq: Four times a day (QID) | ORAL | Status: DC | PRN
  Administered 2016-08-15: 500 mg via ORAL
  Filled 2016-08-13: qty 1

## 2016-08-13 MED ORDER — SODIUM CHLORIDE 0.9 % IV SOLN
Freq: Once | INTRAVENOUS | Status: AC
  Administered 2016-08-13: 18:00:00 via INTRAVENOUS

## 2016-08-13 MED ORDER — ONDANSETRON HCL 4 MG/2ML IJ SOLN
INTRAMUSCULAR | Status: DC | PRN
  Administered 2016-08-13: 4 mg via INTRAVENOUS

## 2016-08-13 MED ORDER — ACETAMINOPHEN 10 MG/ML IV SOLN
INTRAVENOUS | Status: DC | PRN
  Administered 2016-08-13: 1000 mg via INTRAVENOUS

## 2016-08-13 MED ORDER — BUPIVACAINE-EPINEPHRINE (PF) 0.25% -1:200000 IJ SOLN
INTRAMUSCULAR | Status: AC
  Filled 2016-08-13: qty 30

## 2016-08-13 MED ORDER — PROPOFOL 10 MG/ML IV BOLUS
INTRAVENOUS | Status: DC | PRN
  Administered 2016-08-13: 30 mg via INTRAVENOUS
  Administered 2016-08-13: 100 mg via INTRAVENOUS

## 2016-08-13 MED ORDER — METOCLOPRAMIDE HCL 5 MG/ML IJ SOLN
5.0000 mg | Freq: Three times a day (TID) | INTRAMUSCULAR | Status: DC | PRN
  Administered 2016-08-16: 10 mg via INTRAVENOUS
  Filled 2016-08-13: qty 2

## 2016-08-13 MED ORDER — LACTATED RINGERS IV SOLN
INTRAVENOUS | Status: DC | PRN
  Administered 2016-08-13: 10:00:00 via INTRAVENOUS

## 2016-08-13 SURGICAL SUPPLY — 64 items
BAG COUNTER SPONGE EZ (MISCELLANEOUS) ×2 IMPLANT
BAG SPNG 4X4 CLR HAZ (MISCELLANEOUS) ×1
BIT DRILL 4.3 (BIT) IMPLANT
CANISTER SUCT 1200ML W/VALVE (MISCELLANEOUS) ×4 IMPLANT
CAP LOCK NCB (Cap) ×11 IMPLANT
CHLORAPREP W/TINT 26ML (MISCELLANEOUS) ×2 IMPLANT
DRAPE C-ARM XRAY 36X54 (DRAPES) ×1 IMPLANT
DRILL BIT 4.3 (BIT) ×4
DRSG AQUACEL AG ADV 3.5X10 (GAUZE/BANDAGES/DRESSINGS) ×2 IMPLANT
DRSG AQUACEL AG ADV 3.5X14 (GAUZE/BANDAGES/DRESSINGS) ×1 IMPLANT
ELECT BLADE 6.5 EXT (BLADE) ×1 IMPLANT
ELECT REM PT RETURN 9FT ADLT (ELECTROSURGICAL) ×2
ELECTRODE REM PT RTRN 9FT ADLT (ELECTROSURGICAL) ×1 IMPLANT
GAUZE PETRO XEROFOAM 1X8 (MISCELLANEOUS) ×1 IMPLANT
GAUZE SPONGE 4X4 12PLY STRL (GAUZE/BANDAGES/DRESSINGS) ×2 IMPLANT
GLOVE BIO SURGEON STRL SZ8 (GLOVE) ×6 IMPLANT
GLOVE SURG ORTHO 8.5 STRL (GLOVE) ×2 IMPLANT
GLOVE SURG XRAY 8.5 LX (GLOVE) ×2 IMPLANT
GOWN STRL REUS W/ TWL LRG LVL3 (GOWN DISPOSABLE) ×2 IMPLANT
GOWN STRL REUS W/TWL LRG LVL3 (GOWN DISPOSABLE) ×6
HEMOVAC 400CC 10FR (MISCELLANEOUS) ×2 IMPLANT
IMMOBILIZER SHDR LG LX 900803 (SOFTGOODS) ×1 IMPLANT
K-WIRE 2.0 (WIRE) ×2
K-WIRE FXSTD 280X2XNS SS (WIRE) ×1
KIT RM TURNOVER STRD PROC AR (KITS) ×2 IMPLANT
KWIRE FXSTD 280X2XNS SS (WIRE) IMPLANT
MAT BLUE FLOOR 46X72 FLO (MISCELLANEOUS) ×2 IMPLANT
NDL FILTER BLUNT 18X1 1/2 (NEEDLE) ×1 IMPLANT
NDL SPNL 18GX3.5 QUINCKE PK (NEEDLE) ×1 IMPLANT
NEEDLE FILTER BLUNT 18X 1/2SAF (NEEDLE) ×1
NEEDLE FILTER BLUNT 18X1 1/2 (NEEDLE) ×1 IMPLANT
NEEDLE SPNL 18GX3.5 QUINCKE PK (NEEDLE) ×2 IMPLANT
NS IRRIG 1000ML POUR BTL (IV SOLUTION) ×4 IMPLANT
PACK HIP COMPR (MISCELLANEOUS) ×1 IMPLANT
PACK HIP PROSTHESIS (MISCELLANEOUS) ×1 IMPLANT
PADDING CAST BLEND 6X4 STRL (MISCELLANEOUS) IMPLANT
PADDING STRL CAST 6IN (MISCELLANEOUS) ×2
PLATE NCB PPP 9H (Plate) ×1 IMPLANT
SCREW 5.0 32MM (Screw) ×1 IMPLANT
SCREW 5.0 60MM (Screw) ×1 IMPLANT
SCREW CANCELLOUS 5.0X80 (Screw) ×2 IMPLANT
SCREW NCB 5.0X34MM (Screw) ×2 IMPLANT
SCREW NCB 5.0X36MM (Screw) ×1 IMPLANT
SCREW NCB 5.0X38 (Screw) ×1 IMPLANT
SCREW NCB 5.0X55MM (Screw) ×1 IMPLANT
SCREW NCB 5.0X60 (Screw) IMPLANT
SCREW NCB 5.0X60MM (Screw) ×2 IMPLANT
SCREW NCB 5.0X70MM (Screw) ×2 IMPLANT
SCREW NCB 5.0X75MM (Screw) ×2 IMPLANT
SCREW NCB PT 75X32X5XHEX (Screw) IMPLANT
SOL PREP PVP 2OZ (MISCELLANEOUS) ×2
SOLUTION PREP PVP 2OZ (MISCELLANEOUS) ×1 IMPLANT
SPONGE LAP 18X18 5 PK (GAUZE/BANDAGES/DRESSINGS) ×4 IMPLANT
STAPLER SKIN PROX 35W (STAPLE) ×2 IMPLANT
STOCKINETTE BIAS CUT 6 980064 (GAUZE/BANDAGES/DRESSINGS) ×2 IMPLANT
SUT DVC 2 QUILL PDO  T11 36X36 (SUTURE) ×1
SUT DVC 2 QUILL PDO T11 36X36 (SUTURE) IMPLANT
SUT QUILL 0 20X36 (SUTURE) ×2 IMPLANT
SUT QUILL PDO 0 36 36 VIOLET (SUTURE) ×1 IMPLANT
SUT VIC AB 2-0 CT1 27 (SUTURE) ×4
SUT VIC AB 2-0 CT1 TAPERPNT 27 (SUTURE) ×2 IMPLANT
SYR 30ML LL (SYRINGE) ×2 IMPLANT
SYR 5ML LL (SYRINGE) ×2 IMPLANT
TRAY FOLEY W/METER SILVER 16FR (SET/KITS/TRAYS/PACK) ×1 IMPLANT

## 2016-08-13 NOTE — H&P (Signed)
THE PATIENT WAS SEEN PRIOR TO SURGERY TODAY.  HISTORY, ALLERGIES, HOME MEDICATIONS AND OPERATIVE PROCEDURE WERE REVIEWED. RISKS AND BENEFITS OF SURGERY DISCUSSED WITH PATIENT AGAIN.  NO CHANGES FROM INITIAL HISTORY AND PHYSICAL NOTED.    

## 2016-08-13 NOTE — Anesthesia Procedure Notes (Signed)
Procedure Name: Intubation Date/Time: 08/13/2016 10:41 AM Performed by: Marlana SalvageJESSUP, Tamana Hatfield Pre-anesthesia Checklist: Patient identified, Emergency Drugs available, Suction available and Patient being monitored Patient Re-evaluated:Patient Re-evaluated prior to inductionOxygen Delivery Method: Circle system utilized Preoxygenation: Pre-oxygenation with 100% oxygen Intubation Type: IV induction Ventilation: Mask ventilation without difficulty Laryngoscope Size: Mac and 3 Grade View: Grade II Number of attempts: 1 Placement Confirmation: ETT inserted through vocal cords under direct vision,  positive ETCO2 and breath sounds checked- equal and bilateral Secured at: 22 cm Tube secured with: Tape Dental Injury: Teeth and Oropharynx as per pre-operative assessment

## 2016-08-13 NOTE — Op Note (Signed)
08/12/2016 - 08/13/2016  1:16 PM  PATIENT:  Angelica Lewis    PRE-OPERATIVE DIAGNOSIS:  right distal femur fracture  POST-OPERATIVE DIAGNOSIS:  Same  PROCEDURE:  OPEN REDUCTION INTERNAL FIXATION (ORIF) DISTAL FEMUR FRACTURE WITH ZIMMER 9 HOLE RIGHT PERIPROSTHETIC PLATE AND SCREWS  SURGEON:  Valinda HoarMILLER,Hurley Sobel E, MD  ANESTHESIA: GENERAL ET  PREOPERATIVE INDICATIONS:  Angelica FlavorsMartha Lewis is a  82 y.o. female with a diagnosis of right distal femur fracture who elected for surgical management after extensive discussion of  the risks benefits and alternatives  with the patient preoperatively including but not limited to the risks of infection, bleeding, nerve injury, cardiopulmonary complications, the need for revision surgery, among others, and the patient was willing to proceed.  EBL: 200 CC  OPERATIVE IMPLANTS: ZIMMER 9 HOLE RIGHT PERIPROSTHETIC PLATE AND SCREWS  OPERATIVE FINDINGS: COMMINUTED SUPRACONDYLAR RIGHT DISTAL FEMUR FRACTURE WITH TOTAL KNEE  OPERATIVE PROCEDURE: The patient was brought to the operating room and underwent satisfactory  anesthesia and was placed in the supine position on the fracture table.  The operative leg was prepped and draped in sterile fashion and the soft tissues infiltrated with half percent Sensorcaine with epinephrine.  A longitudinal lateral incision was made over the distal third of the femur with dissection carried out sharply through fascia down to bone.  The vastus lateralis was lifted anteriorly to expose the distal shaft.  The soft tissues were debrided off the lateral condyle, allowing elevation of the patella.  Irrigation was used. The fracture fragments were manipulated into the best possible position due to the comminution.  A Zimmer 9 hole periprsthetic distal femur plate was aligned along the femur.  It was temporarily fixated with K wires distally and proximally.  Several cancellous screws were then placed through the distal portion of the plate.  The femur  was reduced to the best of my ability and  proximal portion of the femur was filled with several cortical screws.  Fluoroscopy showed overall good alignment on AP and lateral views with minimal recurvatum.  Remaining distal screw holes were filled with cancellous screws.  Locking caps were applied.  Fluoroscopy showed good alignment on AP lateral views.  Hemovac was put in place. Wound was again irrigated and closed with #2 Quill on fascia, 0 Quill on subcutaneous tissue and staples on all skin areas.  Aquacel was applied.  Sponge and needle count was correct.  Soft long-leg dressing and knee immobilizer were applied.  The patient was awakened and taken to recovery in good condition.   Valinda HoarHoward E Shivali Quackenbush, MD

## 2016-08-13 NOTE — Transfer of Care (Signed)
Immediate Anesthesia Transfer of Care Note  Patient: Angelica Lewis  Procedure(s) Performed: Procedure(s): OPEN REDUCTION INTERNAL FIXATION (ORIF) DISTAL FEMUR FRACTURE (Right)  Patient Location: PACU  Anesthesia Type:General  Level of Consciousness: responds to stimulation  Airway & Oxygen Therapy: Patient Spontanous Breathing and Patient connected to nasal cannula oxygen  Post-op Assessment: Report given to RN and Post -op Vital signs reviewed and stable  Post vital signs: Reviewed and stable  Last Vitals:  Vitals:   08/13/16 0746 08/13/16 1325  BP: (!) 109/53 (!) 91/46  Pulse: (!) 103 64  Resp: 18 20  Temp: 36.6 C 36.3 C    Last Pain:  Vitals:   08/13/16 1325  TempSrc:   PainSc: 0-No pain         Complications: No apparent anesthesia complications

## 2016-08-13 NOTE — Transfer of Care (Deleted)
Immediate Anesthesia Transfer of Care Note  Patient: Angelica Lewis  Procedure(s) Performed: Procedure(s): OPEN REDUCTION INTERNAL FIXATION (ORIF) DISTAL FEMUR FRACTURE (Right)  Patient Location: PACU  Anesthesia Type:General  Level of Consciousness: awake and responds to stimulation  Airway & Oxygen Therapy: Patient Spontanous Breathing and Patient connected to nasal cannula oxygen  Post-op Assessment: Report given to RN and Post -op Vital signs reviewed and stable  Post vital signs: Reviewed and stable  Last Vitals:  Vitals:   08/13/16 0746 08/13/16 1325  BP: (!) 109/53 (!) 91/46  Pulse: (!) 103 64  Resp: 18   Temp: 36.6 C 36.3 C    Last Pain:  Vitals:   08/13/16 1325  TempSrc:   PainSc: 0-No pain         Complications: No apparent anesthesia complications

## 2016-08-13 NOTE — Progress Notes (Signed)
Patient was taken to OR via room bed. Signed consent and IV ABX sent with patient. Right knee in immobilizer and right arm in sling. CHG bath finished. Beta blocker given with sips of water. BS was 143, patient gave herself 2 units from her insulin pump. Handoff given.

## 2016-08-13 NOTE — Progress Notes (Signed)
After 1 Liter bolus given, BP 94/40. Notified MD. New orders to given 1 Liter bolus NS.

## 2016-08-13 NOTE — Progress Notes (Signed)
Patient returned from PACU. Alert, but sleepy. BP 100/45. No blood sugars to report to this RN. BS taken once in room was 109. At 1604, BP 97/36. Notified Dr. Luberta MutterKonidena. New orders to give 1L NS bolus and start fluids NS at 100 cc/hr after bolus. Also unable to get a temp. Bear Hugger applied.

## 2016-08-13 NOTE — OR Nursing (Signed)
Patient b/p down fluid bolus infusing

## 2016-08-13 NOTE — Anesthesia Preprocedure Evaluation (Signed)
Anesthesia Evaluation  Patient identified by MRN, date of birth, ID band Patient awake    Reviewed: Allergy & Precautions, H&P , NPO status , Patient's Chart, lab work & pertinent test results, reviewed documented beta blocker date and time   History of Anesthesia Complications Negative for: history of anesthetic complications  Airway Mallampati: III  TM Distance: >3 FB Neck ROM: full    Dental  (+) Partial Upper, Partial Lower, Poor Dentition, Dental Advidsory Given   Pulmonary neg shortness of breath, sleep apnea and Continuous Positive Airway Pressure Ventilation , neg COPD, neg recent URI,    Pulmonary exam normal breath sounds clear to auscultation       Cardiovascular Exercise Tolerance: Good hypertension, (-) angina(-) CAD, (-) Past MI, (-) Cardiac Stents and (-) CABG Normal cardiovascular exam+ dysrhythmias Atrial Fibrillation (-) Valvular Problems/Murmurs Rhythm:regular Rate:Normal     Neuro/Psych negative neurological ROS  negative psych ROS   GI/Hepatic Neg liver ROS, GERD  ,  Endo/Other  diabetes, Insulin Dependent  Renal/GU negative Renal ROS  negative genitourinary   Musculoskeletal   Abdominal   Peds  Hematology  (+) Blood dyscrasia, anemia ,   Anesthesia Other Findings Past Medical History: No date: Age-related osteoporosis with current patholog* No date: Chronic pain syndrome No date: Diabetes mellitus without complication (HCC) No date: GERD (gastroesophageal reflux disease) No date: Hypertension No date: Other iron deficiency anemias No date: Paroxysmal a-fib (HCC)   Reproductive/Obstetrics negative OB ROS                             Anesthesia Physical Anesthesia Plan  ASA: III  Anesthesia Plan: General   Post-op Pain Management:    Induction:   Airway Management Planned:   Additional Equipment:   Intra-op Plan:   Post-operative Plan:   Informed  Consent: I have reviewed the patients History and Physical, chart, labs and discussed the procedure including the risks, benefits and alternatives for the proposed anesthesia with the patient or authorized representative who has indicated his/her understanding and acceptance.   Dental Advisory Given  Plan Discussed with: Anesthesiologist, CRNA and Surgeon  Anesthesia Plan Comments:         Anesthesia Quick Evaluation

## 2016-08-13 NOTE — Progress Notes (Signed)
PT Cancellation Note  Patient Details Name: Angelica Lewis MRN: [id number] DOB: May 31, 1933   Cancelled Treatment:    Reason Eval/Treat Not Completed: Patient at procedure or test/unavailable (in surgery).  Will check on her tomorrow.   Ivar DrapeStout, Harrington Jobe E 08/13/2016, 11:16 AM    Samul Dadauth Gabrielle Mester, PT MS Acute Rehab Dept. Number: [facility] [phone] and [facility] [phone]

## 2016-08-14 ENCOUNTER — Inpatient Hospital Stay
Admit: 2016-08-14 | Discharge: 2016-08-14 | Disposition: A | Discharge status: Home / Self Care | Payer: Medicare Other | Attending: Internal Medicine | Admitting: Internal Medicine

## 2016-08-14 ENCOUNTER — Encounter: Payer: Self-pay | Admitting: Specialist

## 2016-08-14 LAB — GLUCOSE, CAPILLARY
GLUCOSE-CAPILLARY: 187 mg/dL — AB (ref 65–99)
GLUCOSE-CAPILLARY: 194 mg/dL — AB (ref 65–99)
Glucose-Capillary: 148 mg/dL — ABNORMAL HIGH (ref 65–99)
Glucose-Capillary: 189 mg/dL — ABNORMAL HIGH (ref 65–99)
Glucose-Capillary: 193 mg/dL — ABNORMAL HIGH (ref 65–99)

## 2016-08-14 LAB — ECHOCARDIOGRAM COMPLETE
Height: 64 in
Weight: 3012.8 oz

## 2016-08-14 MED ORDER — PRAVASTATIN SODIUM 20 MG PO TABS
20.0000 mg | ORAL_TABLET | Freq: Every day | ORAL | Status: DC
  Administered 2016-08-14 – 2016-08-16 (×3): 20 mg via ORAL
  Filled 2016-08-14 (×3): qty 1

## 2016-08-14 MED ORDER — FERROUS SULFATE 325 (65 FE) MG PO TABS
325.0000 mg | ORAL_TABLET | Freq: Every day | ORAL | Status: DC
  Administered 2016-08-15 – 2016-08-17 (×3): 325 mg via ORAL
  Filled 2016-08-14 (×3): qty 1

## 2016-08-14 MED ORDER — SERTRALINE HCL 100 MG PO TABS
100.0000 mg | ORAL_TABLET | Freq: Every day | ORAL | Status: DC
  Administered 2016-08-15 – 2016-08-17 (×3): 100 mg via ORAL
  Filled 2016-08-14 (×4): qty 1

## 2016-08-14 MED ORDER — GABAPENTIN 300 MG PO CAPS
600.0000 mg | ORAL_CAPSULE | Freq: Every day | ORAL | Status: DC
  Administered 2016-08-14 – 2016-08-16 (×3): 600 mg via ORAL
  Filled 2016-08-14 (×3): qty 2

## 2016-08-14 NOTE — Progress Notes (Addendum)
Noted in reviewing chart that patient has history of DM2 and uses Trulicity 1.5 mg weekly and a V-Go insulin pump with Humalog as an outpatient. Patient is followed by Dr. Tiburcio PeaHarris [facility], Endocrinologist) and last seen Dr. Tiburcio PeaHarris on 03/20/16 (per care everywhere tab). According to office note on 03/20/16 by Dr. Tiburcio PeaHarris patient uses a disposable insulin pump called [id number] (which provides 20 units of basal insulin over 24 hours) with Humalog insulin. Patient should be using additional clicks with meals (each click delivers 2 units of insulin); per office note on 03/20/16 by Dr. Tiburcio PeaHarris, patient should be using 3 clicks with breakfast (total of 6 units of insulin), 2 clicks with lunch (total of 4 units of insulin), and 3 clicks with supper (total of 6 units of insulin). Note patient is ordered insulin pump while inpatient but [facility] used by patient for correction. Called unit and spoke with Tresa EndoKelly, RN caring for patient today and inquired about whether patient has insulin pump on. Tresa EndoKelly, RN talked with patient and she has on her insulin pump but it is out of insulin. Patient's daughter is going to patient's residence and get additional insulin pump supplies so patient can apply new insulin pump.  If patient's daughter does not bring new insulin pump supplies back to the hospital today, will need to order SQ insulin for patient while inpatient.  Patient uses a V-Go insulin pump which should be changed out every 24 hours.   NURSING: Please print off the Patient insulin pump contract and flow sheet (if not already done). The insulin pump contract should be signed by the patient and then placed in the chart. The patient insulin pump flow sheet will be completed by the patient at the bedside and the RN caring for the patient will use the patient's flow sheet to document [facility]. RN will need to complete the Nursing Insulin Pump Flowsheet at least once a shift. Patient will need to keep extra  insulin pump supplies at the bedside at all times. Patient will need to change out V-Go insulin pump every 24 hours (as V-Go insulin pump is a disposable insulin pump).  Thanks, Orlando PennerMarie Ashlley Booher, RN, MSN, CDE Diabetes Coordinator Inpatient Diabetes Program [phone] (Team Pager from 8am to 5pm) [phone] ([facility] office) [phone] [facility] office) [phone] [facility] office)

## 2016-08-14 NOTE — Progress Notes (Signed)
Foley d./c'd at 0530 

## 2016-08-14 NOTE — Progress Notes (Cosign Needed)
Angelica Lewis is a 82 y.o. female  [id number]  Primary Cardiologist: Adrian BlackwaterShaukat Khan Reason for Consultation: history of atrial fibrillation  HPI: Angelica Lewis was admitted after a fall and found to have femur fracture that was surgically repaired yesterday. She has a history of atrial fibrillation treated with sotalol and diltiazem and anticoagulation with Eliquis for stroke risk reduction. She has no previous coronary artery disease. She is [facility] She also has history of diabetes and hypertension. Currently her heart rate is regular on telemetry monitor shows sinus rhythm in the 70s and 80s with occasional PVCs. Patient has been hypotensive after surgery and is receiving IV fluids.   Review of Systems: Negative for chest pain, shortness of breath, lightheadedness   Past Medical History:  Diagnosis Date  . Age-related osteoporosis with current pathological fracture of right humerus   . Chronic pain syndrome   . Diabetes mellitus without complication (HCC)   . GERD (gastroesophageal reflux disease)   . Hypertension   . Other iron deficiency anemias   . Paroxysmal a-fib (HCC)     Medications Prior to Admission  Medication Sig Dispense Refill  . acetaminophen (TYLENOL) 325 MG tablet Take 325 mg by mouth every 6 (six) hours as needed for moderate pain. *Chronic pain*    . apixaban (ELIQUIS) 2.5 MG TABS tablet Take 2.5 mg by mouth 2 (two) times daily. *Hold this medication for 48 hours starting today 08/12/16 per MD*    . aspirin 81 MG chewable tablet Chew 81 mg by mouth daily.    . carboxymethylcellulose (REFRESH PLUS) 0.5 % SOLN Place 1 drop into both eyes 4 (four) times daily as needed.    . Cholecalciferol (VITAMIN D-3) 1000 units CAPS Take 1,000 Units by mouth daily.    Marland Kitchen. co-enzyme Q-10 30 MG capsule Take 30 mg by mouth daily.    . Cyanocobalamin 1000 MCG TBCR Take 1,000 mcg by mouth daily.    Marland Kitchen. diltiazem (CARDIZEM CD) 120 MG 24 hr capsule Take 120  mg by mouth daily.    . Dulaglutide (TRULICITY) 1.5 MG/0.5ML SOPN Inject 1.5 mg into the skin daily.    . ferrous sulfate 325 (65 FE) MG tablet Take 325 mg by mouth daily.    Marland Kitchen. gabapentin (NEURONTIN) 300 MG capsule Take 600 mg by mouth at bedtime.    . hydrocortisone cream 1 % Apply 1 application topically 2 (two) times daily.    . insulin lispro (HUMALOG) 100 UNIT/ML injection Inject into the skin See admin instructions. Use as directed before meals and at bedtime per sliding scale. BS: less than 60= Call MD              151-200= 2 units,              201-250= 4 units,              251-300= 6 units,              301-350= 8 units,              351-400= 10 units, Greater than 400= 12 units and Call MD.    . lidocaine (LIDODERM) 5 % Place 1 patch onto the skin daily. Remove & Discard patch within 12 hours or as directed by MD    . losartan (COZAAR) 25 MG tablet Take 25 mg by mouth daily.    Marland Kitchen. lovastatin (MEVACOR) 20 MG tablet Take 20 mg by mouth daily.    .Marland Kitchen  Multiple Vitamin (MULTIVITAMIN) tablet Take 1 tablet by mouth daily.    . [EXPIRED] nitrofurantoin, macrocrystal-monohydrate, (MACROBID) 100 MG capsule Take 100 mg by mouth every 12 (twelve) hours.    Marland Kitchen omeprazole (PRILOSEC) 20 MG capsule Take 20 mg by mouth daily.    Marland Kitchen POLYETHYLENE GLYCOL 3350 PO Take 17 g by mouth daily as needed. For constipation. *Mix in 4-8 oz. of fluid*    . promethazine (PHENERGAN) 12.5 MG tablet Take 12.5 mg by mouth every 6 (six) hours as needed for nausea.    Marland Kitchen senna (SENOKOT) 8.6 MG TABS tablet Take 17.2 mg by mouth at bedtime as needed for mild constipation or moderate constipation.    . sertraline (ZOLOFT) 100 MG tablet Take 100 mg by mouth daily.    . sotalol (BETAPACE) 160 MG tablet Take 160 mg by mouth 2 (two) times daily.    . vitamin C (ASCORBIC ACID) 500 MG tablet Take 500 mg by mouth daily.       Marland Kitchen apixaban  2.5 mg Oral BID  .  ceFAZolin (ANCEF) IV  2 g Intravenous Q6H  . celecoxib  200 mg Oral Q12H   . clindamycin (CLEOCIN) IV  900 mg Intravenous Q8H  . insulin pump   Subcutaneous TID AC, HS, 0200  . senna  1 tablet Oral BID    Infusions: . sodium chloride 100 mL/hr at 08/13/16 1729    Allergies  Allergen Reactions  . Fentanyl     Other reaction(s): Other (See Comments) Other Reaction: cardiac arrest  . Morphine     Other reaction(s): Other (See Comments) Other Reaction: respiratory distress  . Ace Inhibitors Other (See Comments)    Unknown reaction  . Ciprofloxacin     Other reaction(s): Unknown  . Codeine Sulfate Hives  . Doxycycline     Other reaction(s): Other (See Comments) Other Reaction: skin lesions  . Levofloxacin Swelling  . Morphine Sulfate Other (See Comments)    Unknown reaction  . Oxybenzone Other (See Comments)    Unknown reaction  . Oxycodone Nausea Only  . Oxycontin [Oxycodone Hcl]   . Penicillins Other (See Comments)    Unknown reaction  . Tape     Social History   Social History  . Marital status: Divorced    Spouse name: N/A  . Number of children: N/A  . Years of education: N/A   Occupational History  . Not on file.   Social History Main Topics  . Smoking status: Never Smoker  . Smokeless tobacco: Never Used  . Alcohol use No  . Drug use: No  . Sexual activity: No   Other Topics Concern  . Not on file   Social History Narrative  . No narrative on file    History reviewed. No pertinent family history.  PHYSICAL EXAM: Vitals:   08/14/16 0404 08/14/16 0718  BP: (!) 118/52 (!) 107/35  Pulse:  84  Resp:  16  Temp:  99.7 F (37.6 C)     Intake/Output Summary (Last 24 hours) at 08/14/16 1610 Last data filed at 08/14/16 0534  Gross per 24 hour  Intake             3055 ml  Output             1035 ml  Net             2020 ml    General:  Well appearing. No respiratory difficulty HEENT: normal Neck: supple. no JVD. Carotids 2+ bilat;  no bruits. No lymphadenopathy or thryomegaly appreciated. Cor: PMI nondisplaced.  Regular rate & rhythm. No rubs, gallops or murmurs. Lungs: clear Abdomen: soft, nontender, nondistended. No hepatosplenomegaly. No bruits or masses. Good bowel sounds. Extremities: no cyanosis, clubbing, rash, edema Neuro: alert & oriented x 3, cranial nerves grossly intact. moves all 4 extremities w/o difficulty. Affect pleasant.   Results for orders placed or performed during the hospital encounter of 08/12/16 (from the past 24 hour(s))  Glucose, capillary     Status: Abnormal   Collection Time: 08/13/16  3:25 PM  Result Value Ref Range   Glucose-Capillary 109 (H) 65 - 99 mg/dL   Comment 1 Notify RN   Glucose, capillary     Status: Abnormal   Collection Time: 08/13/16  9:09 PM  Result Value Ref Range   Glucose-Capillary 155 (H) 65 - 99 mg/dL   Comment 1 Notify RN   Glucose, capillary     Status: Abnormal   Collection Time: 08/14/16  1:45 AM  Result Value Ref Range   Glucose-Capillary 148 (H) 65 - 99 mg/dL   Comment 1 Notify RN   Glucose, capillary     Status: Abnormal   Collection Time: 08/14/16  7:23 AM  Result Value Ref Range   Glucose-Capillary 187 (H) 65 - 99 mg/dL   Dg Chest 1 View  Result Date: 08/12/2016 CLINICAL DATA:  Pt presents to ED via EMS from [facility] c/o fall and right knee pain. Pt states she slipped on the floor in the bathroom and landed on right knee. Pt has hx R humerus fracture x 1 week, pt supposed to f/u with surgeon 08/16/16 at [facility]. All previous xrays obtained at [facility]. EXAM: CHEST 1 VIEW COMPARISON:  None. FINDINGS: Cardiac silhouette is normal in size. No mediastinal or hilar masses or evidence of adenopathy. Clear lungs.  No pleural effusion or pneumothorax. Proximal right humeral fracture is incompletely imaged. Bones are demineralized. No other fractures. IMPRESSION: No acute cardiopulmonary disease. Electronically Signed   By: Amie Portland M.D.   On: 08/12/2016 09:40   Ct Head Wo Contrast  Result Date: 08/12/2016 CLINICAL DATA:  Pt presents [facility] ED  [facility] from [facility] c/o fall and R knee pain. Pt states she slipped on the floor in the bathroom and landed on R knee. Pt denies LOC but states she did hit her head. EXAM: CT HEAD WITHOUT CONTRAST TECHNIQUE: Contiguous axial images were obtained from the base of the skull through the vertex without intravenous contrast. COMPARISON:  None. FINDINGS: Brain: The ventricles are normal in configuration. There is ventricular and sulcal enlargement reflecting mild to moderate diffuse atrophy. No hydrocephalus. There are no parenchymal masses or mass effect. There is no evidence of a cortical infarct. Patchy white matter hypoattenuation is noted consistent with moderate chronic microvascular ischemic change. There are no extra-axial masses or abnormal fluid collections. There is no intracranial hemorrhage. Vascular: No hyperdense vessel or unexpected calcification. Skull: Normal. Negative for fracture or focal lesion. Sinuses/Orbits: No acute finding. Other: None. IMPRESSION: 1. No acute intracranial abnormalities. No skull fracture or intracranial hemorrhage. 2. Atrophy and chronic microvascular ischemic change. Electronically Signed   By: Amie Portland M.D.   On: 08/12/2016 09:06   Dg Humerus Right  Result Date: 08/12/2016 CLINICAL DATA:  Pt presents to ED via EMS from [facility] c/o fall and right knee pain. Pt states she slipped on the floor in the bathroom and landed on right knee. Pt has hx R humerus fracture  x 1 week, pt supposed to f/u with surgeon 08/16/16 at [facility]. All previous xrays obtained at [facility]. EXAM: RIGHT HUMERUS - 2+ VIEW COMPARISON:  None. FINDINGS: There is a comminuted fracture of the right humerus. An oblique to spiral fracture component extends from the midshaft to the proximal shaft with a more comminuted fracture of the metaphysis. There is less than 1 cm fracture displacement. There is mild apex anteromedial angulation. Bones are demineralized. Shoulder and elbow joints are normally  aligned. IMPRESSION: Comminuted fracture of the proximal right humerus as described. No dislocation. Electronically Signed   By: Amie Portland M.D.   On: 08/12/2016 09:39   Dg C-arm 61-120 Min  Result Date: 08/13/2016 CLINICAL DATA:  Right distal femur ORIF. EXAM: DG C-ARM 61-120 MIN; RIGHT FEMUR 1 VIEW FLUOROSCOPY TIME:  51 seconds COMPARISON:  Right femur radiographs - 08/12/2016 FINDINGS: 4 spot intraoperative fluoroscopic images of the right knee are provided for review. Images demonstrate the sequela of sideplate fixation of known comminuted distal femoral metaphysis seal fracture. The sideplate is transfixed with at least 7 cancellous screws. There is minimal residual displacement of the fracture fragments (seen best in the provided lateral radiograph) though overall there is marked improvement in alignment. Expected adjacent subcutaneous emphysema. An apparent surgical drain / sponge is seen overlying the operative site on the provided more cranial anterior projection image though this is not in seen on the provided lateral radiograph and thus presumably external to the patient. Stable sequela of prior right total knee replacement, incompletely evaluated. IMPRESSION: Post sideplate fixation of comminuted distal femoral metaphysis of fracture. Electronically Signed   By: Simonne Come M.D.   On: 08/13/2016 12:53   Dg Femur 1v Right  Result Date: 08/13/2016 CLINICAL DATA:  Right distal femur ORIF. EXAM: DG C-ARM 61-120 MIN; RIGHT FEMUR 1 VIEW FLUOROSCOPY TIME:  51 seconds COMPARISON:  Right femur radiographs - 08/12/2016 FINDINGS: 4 spot intraoperative fluoroscopic images of the right knee are provided for review. Images demonstrate the sequela of sideplate fixation of known comminuted distal femoral metaphysis seal fracture. The sideplate is transfixed with at least 7 cancellous screws. There is minimal residual displacement of the fracture fragments (seen best in the provided lateral radiograph) though  overall there is marked improvement in alignment. Expected adjacent subcutaneous emphysema. An apparent surgical drain / sponge is seen overlying the operative site on the provided more cranial anterior projection image though this is not in seen on the provided lateral radiograph and thus presumably external to the patient. Stable sequela of prior right total knee replacement, incompletely evaluated. IMPRESSION: Post sideplate fixation of comminuted distal femoral metaphysis of fracture. Electronically Signed   By: Simonne Come M.D.   On: 08/13/2016 12:53   Dg Femur Min 2 Views Right  Result Date: 08/12/2016 CLINICAL DATA:  Pt presents to ED via EMS from [facility] c/o fall and right knee pain. Pt states she slipped on the floor in the bathroom and landed on right knee. Pt has hx R humerus fracture x 1 week, pt supposed to f/u with surgeon 08/16/16 at [facility]. All previous xrays obtained at [facility]. EXAM: RIGHT FEMUR 2 VIEWS COMPARISON:  None. FINDINGS: There is a comminuted fracture of the distal femur, extending from the posterior distal diaphysis across metaphysis. The primary distal fracture component is displaced posteriorly by approximately 2.4 cm. There is also angulation between the proximal and distal components with the distal component angulated posteriorly by 45 degrees. The knee prosthetic components are well-seated and  normally aligned. Hip joint is normally spaced and aligned. Bones are demineralized. IMPRESSION: Comminuted, displaced and angulated fracture of the distal right femur above the right knee femoral prosthetic component. No dislocation. Electronically Signed   By: Amie Portland M.D.   On: 08/12/2016 09:42   Dg Femur Port, Min 2 Views Right  Result Date: 08/13/2016 CLINICAL DATA:  Postop ORIF of distal right femur fracture. EXAM: RIGHT FEMUR PORTABLE 1 VIEW COMPARISON:  Radiographs 08/12/2016. Intraoperative radiographs earlier today. FINDINGS: Previous right total knee arthroplasty  without hardware loosening. Interval fixation of the comminuted distal femoral fracture with a lateral plate and screws. There is near anatomic reduction of the main fracture fragments. There is a small residual posterior butterfly fragment on the lateral view. Surgical drain and skin staples are in place. The proximal tibia and fibula appear intact. IMPRESSION: Near anatomic reduction of comminuted distal femur fracture post ORIF. No demonstrated complication. Electronically Signed   By: Carey Bullocks M.D.   On: 08/13/2016 14:23     ASSESSMENT AND PLAN: This patient has a history of atrial fibrillation treated with sotalol and diltiazem and anticoagulation for stroke risk reduction. She underwent surgical repair of fractured femur yesterday and is doing well this morning. Her telemetry is reading normal sinus rhythm with occasional PVCs and she denies any chest pain or shortness of breath. She reports having no CAD and is followed for cardiology at [facility] for her atrial fibrillation. Currently, she is hypotensive and is not receiving her diltiazem or sotalol. Advise resuming these medications when her blood pressure is able to tolerate. We'll continue to monitor.    Berton Bon, NP 08/14/2016 8:22 AM

## 2016-08-14 NOTE — Progress Notes (Signed)
*  PRELIMINARY RESULTS* Echocardiogram 2D Echocardiogram has been performed.  Angelica Lewis, Angelica Lewis 08/14/2016, 9:40 AM

## 2016-08-14 NOTE — Evaluation (Signed)
Physical Therapy Evaluation Patient Details Name: Angelica FlavorsMartha Lewis MRN: [id number] DOB: 1933/04/26 Today's Date: 08/14/2016   History of Present Illness  Pt is an 82 yo F who fell around 08/01/16 resulting in R humeral fracture.  [facility] and had another fall resulting in a distal R femur fracture and is s/p ORIF.  Clinical Impression  Pt presents with deficits in strength, gait, mobility, balance, transfers, and activity tolerance.  Pt had a prior fall resulting in a R humeral fracture and was at [facility] receiving rehab services at time of current fall.  Spoke to Engineer, maintenancerehab manager at [facility] who reported that patient was NWB through RUE at time of current fall resulting in R femur Fx with RLE now NWB as well.  Pt required +2 max A with bed mobility and was unable to attempt transfer secondary to RUE and RLE NWB status, pain in RLE with movement, and fatigue.  Pt will benefit from PT services to address above deficits for decreased caregiver assistance upon discharge.        Follow Up Recommendations SNF    Equipment Recommendations   (Will need to determine at a future date secondary to current RUE NWB status)   Recommendations for Other Services       Precautions / Restrictions Precautions Precautions: Fall Required Braces or Orthoses: Knee Immobilizer - Right;Sling (RUE sling) Restrictions Weight Bearing Restrictions: Yes RUE Weight Bearing: Non weight bearing RLE Weight Bearing: Non weight bearing      Mobility  Bed Mobility Overal bed mobility: +2 for physical assistance;Needs Assistance Bed Mobility: Supine to Sit;Sit to Supine     Supine to sit: +2 for physical assistance Sit to supine: +2 for physical assistance      Transfers                 General transfer comment: Unable to assess secondary to NWB RUE and RLE, general weakness, and RLE pain with movement  Ambulation/Gait             General Gait Details: Non  ambulatory  Stairs            Wheelchair Mobility    Modified Rankin (Stroke Patients Only)       Balance Overall balance assessment: Needs assistance   Sitting balance-Leahy Scale: Fair         Standing balance comment: Unable to stand                             Pertinent Vitals/Pain Pain Assessment: 0-10 Pain Score: 7  Pain Location: RLE Pain Descriptors / Indicators: Aching Pain Intervention(s): RN gave pain meds during session    Home Living Family/patient expects to be discharged to:: Private residence Living Arrangements: Alone   Type of Home: House Home Access: Ramped entrance     Home Layout: One level        Prior Function Level of Independence: Independent         Comments: Ind with amb without AD, ind with ADLs     Hand Dominance   Dominant Hand: Right    Extremity/Trunk Assessment   Upper Extremity Assessment: RUE deficits/detail RUE Deficits / Details: R humeral Fx, in a sling         Lower Extremity Assessment: RLE deficits/detail RLE Deficits / Details: Functionally R hip strength <3/5       Communication   Communication: No difficulties  Cognition  Arousal/Alertness: Awake/alert Behavior During Therapy: WFL for tasks assessed/performed Overall Cognitive Status: Within Functional Limits for tasks assessed                      General Comments      Exercises     Assessment/Plan    PT Assessment Patient needs continued PT services  PT Problem List Decreased strength;Decreased activity tolerance;Decreased balance;Decreased mobility          PT Treatment Interventions DME instruction;Gait training;Functional mobility training;Therapeutic activities;Therapeutic exercise;Balance training;Neuromuscular re-education;Patient/family education    PT Goals (Current goals can be found in the Care Plan section)  Acute Rehab PT Goals Patient Stated Goal: Get up and go to the bathroom by myself PT  Goal Formulation: With patient Time For Goal Achievement: 08/27/16 Potential to Achieve Goals: Fair    Frequency 7X/week   Barriers to discharge        Co-evaluation               End of Session Equipment Utilized During Treatment: Oxygen Activity Tolerance: Patient limited by fatigue;Patient limited by pain Patient left: in bed;with nursing/sitter in room;with bed alarm set;with call bell/phone within reach;with family/visitor present           Time: 1610-9604 PT Time Calculation (min) (ACUTE ONLY): 25 min   Charges:   PT Evaluation $PT Eval Low Complexity: 1 Procedure     PT G Codes:        DElly Modena PT, DPT 08/14/16, 12:07 PM

## 2016-08-14 NOTE — Progress Notes (Signed)
Subjective: 1 Day Post-Op Procedure(s) (LRB): OPEN REDUCTION INTERNAL FIXATION (ORIF) DISTAL FEMUR FRACTURE (Right)    Patient reports pain as mild. Feels better than pre op.  Drain removed.  hgb stable  Objective:   VITALS:   Vitals:   08/14/16 0718 08/14/16 1048  BP: (!) 107/35 (!) 132/45  Pulse: 84 90  Resp: 16   Temp: 99.7 F (37.6 C)     Neurologically intact ABD soft Neurovascular intact Sensation intact distally Intact pulses distally Dorsiflexion/Plantar flexion intact Incision: no drainage  LABS  Recent Labs  08/12/16 0950 08/13/16 0615  HGB 11.8* 9.6*  HCT 33.2* 27.5*  WBC 10.3 9.2  PLT 392 341     Recent Labs  08/12/16 0950 08/13/16 0615  NA 142 136  K 3.3* 3.6  BUN 17 20  CREATININE 0.69 0.77  GLUCOSE 106* 158*    No results for input(s): LABPT, INR in the last 72 hours.   Assessment/Plan: 1 Day Post-Op Procedure(s) (LRB): OPEN REDUCTION INTERNAL FIXATION (ORIF) DISTAL FEMUR FRACTURE (Right)   Advance diet Up with therapy D/C IV fluids Discharge to SNF

## 2016-08-14 NOTE — Anesthesia Postprocedure Evaluation (Signed)
Anesthesia Post Note  Patient: Angelica FlavorsMartha Lewis  Procedure(s) Performed: Procedure(s) (LRB): OPEN REDUCTION INTERNAL FIXATION (ORIF) DISTAL FEMUR FRACTURE (Right)  Patient location during evaluation: PACU Anesthesia Type: General Level of consciousness: awake and alert Pain management: pain level controlled Vital Signs Assessment: post-procedure vital signs reviewed and stable Respiratory status: spontaneous breathing, nonlabored ventilation, respiratory function stable and patient connected to nasal cannula oxygen Cardiovascular status: blood pressure returned to baseline and stable Postop Assessment: no signs of nausea or vomiting Anesthetic complications: no    Last Vitals:  Vitals:   08/13/16 2111 08/13/16 2117  BP: (!) 90/39 108/68  Pulse: 75   Resp: 19   Temp: 36.6 C     Last Pain:  Vitals:   08/13/16 2140  TempSrc:   PainSc: Asleep                 Lenard SimmerAndrew Jalexia Lalli

## 2016-08-14 NOTE — Care Management Note (Signed)
Case Management Note  Patient Details  Name: Angelica Lewis MRN: [id number] Date of Birth: 1933/07/27  Subjective/Objective:                   Met with patient and a close female friend to discuss discharge planning after patient agreed in the presence of her friend. Patient states she "fell at [facility] where she has been for rehab" O2 is acute. She does not have a platform walker as her right arm is in a sling.  Action/Plan: CSW is aware of patient admission. PT evaluation pending.   Expected Discharge Date:  08/15/16               Expected Discharge Plan:     In-House Referral:  Clinical Social Work  Discharge planning Services  CM Consult  Post Acute Care Choice:    Choice offered to:  Patient  DME Arranged:    DME Agency:     HH Arranged:    [place] Agency:     Status of Service:  In process, will continue to follow  If discussed at Long Length of Stay Meetings, dates discussed:    Additional Comments:  Marshell Garfinkel, RN 08/14/2016, 11:12 AM

## 2016-08-14 NOTE — Progress Notes (Signed)
[facility] [place] [facility]   PATIENT NAME: Angelica FlavorsMartha Lewis    MR#:  [id number]  DATE OF BIRTH:  07-13-1933  SUBJECTIVE: Patient is seen at the bedside. S admitted for fracture of the right femur.   CHIEF COMPLAINT:   Chief Complaint  Patient presents with  . Fall    REVIEW OF SYSTEMS:   ROS CONSTITUTIONAL: No fever, fatigue or weakness.  EYES: No blurred or double vision.  EARS, NOSE, AND THROAT: No tinnitus or ear pain.  RESPIRATORY: No cough, shortness of breath, wheezing or hemoptysis.  CARDIOVASCULAR: No chest pain, orthopnea, edema.  GASTROINTESTINAL: No nausea, vomiting, diarrhea or abdominal pain.  GENITOURINARY: No dysuria, hematuria.  ENDOCRINE: No polyuria, nocturia,  HEMATOLOGY: No anemia, easy bruising or bleeding SKIN: No rash or lesion. MUSCULOSKELETAL:Right leg pain.   NEUROLOGIC: No tingling, numbness, weakness.  PSYCHIATRY: No anxiety or depression.   DRUG ALLERGIES:   Allergies  Allergen Reactions  . Fentanyl     Other reaction(s): Other (See Comments) Other Reaction: cardiac arrest  . Morphine     Other reaction(s): Other (See Comments) Other Reaction: respiratory distress  . Ace Inhibitors Other (See Comments)    Unknown reaction  . Ciprofloxacin     Other reaction(s): Unknown  . Codeine Sulfate Hives  . Doxycycline     Other reaction(s): Other (See Comments) Other Reaction: skin lesions  . Levofloxacin Swelling  . Morphine Sulfate Other (See Comments)    Unknown reaction  . Oxybenzone Other (See Comments)    Unknown reaction  . Oxycodone Nausea Only  . Oxycontin [Oxycodone Hcl]   . Penicillins Other (See Comments)    Unknown reaction  . Tape     VITALS:  Blood pressure (!) 132/45, pulse 90, temperature 99.7 F (37.6 C), temperature source Oral, resp. rate 16, height 5\' 4"  (1.626 m), weight 85.4 kg (188 lb 4.8 oz), SpO2 99 %.  PHYSICAL EXAMINATION:  GENERAL:  82 y.o.-year-old patient lying in the  bed with no acute distress.  EYES: Pupils equal, round, reactive to light and accommodation. No scleral icterus. Extraocular muscles intact.  HEENT: Head atraumatic, normocephalic. Oropharynx and nasopharynx clear.  NECK:  Supple, no jugular venous distention. No thyroid enlargement, no tenderness.  LUNGS: Normal breath sounds bilaterally, no wheezing, rales,rhonchi or crepitation. No use of accessory muscles of respiration.  CARDIOVASCULAR: S1, S2 normal. No murmurs, rubs, or gallops.  ABDOMEN: Soft, nontender, nondistended. Bowel sounds present. No organomegaly or mass.  EXTREMITIES: Mobilized the right leg NEUROLOGIC: Cranial nerves II through XII are intact. Muscle strength 5/5 in all extremities. Sensation intact. Gait not checked.  PSYCHIATRIC: The patient is alert and oriented x 3.  SKIN: No obvious rash, lesion, or ulcer.    LABORATORY PANEL:   CBC  Recent Labs Lab 08/13/16 0615  WBC 9.2  HGB 9.6*  HCT 27.5*  PLT 341   ------------------------------------------------------------------------------------------------------------------  Chemistries   Recent Labs Lab 08/13/16 0615  NA 136  K 3.6  CL 102  CO2 26  GLUCOSE 158*  BUN 20  CREATININE 0.77  CALCIUM 8.4*  AST 22  ALT 15  ALKPHOS 89  BILITOT 0.5   ------------------------------------------------------------------------------------------------------------------  Cardiac Enzymes  Recent Labs Lab 08/13/16 0615  TROPONINI <0.03   ------------------------------------------------------------------------------------------------------------------  RADIOLOGY:  Dg C-arm 61-120 Min  Result Date: 08/13/2016 CLINICAL DATA:  Right distal femur ORIF. EXAM: DG C-ARM 61-120 MIN; RIGHT FEMUR 1 VIEW FLUOROSCOPY TIME:  51 seconds COMPARISON:  Right femur radiographs - 08/12/2016  FINDINGS: 4 spot intraoperative fluoroscopic images of the right knee are provided for review. Images demonstrate the sequela of sideplate  fixation of known comminuted distal femoral metaphysis seal fracture. The sideplate is transfixed with at least 7 cancellous screws. There is minimal residual displacement of the fracture fragments (seen best in the provided lateral radiograph) though overall there is marked improvement in alignment. Expected adjacent subcutaneous emphysema. An apparent surgical drain / sponge is seen overlying the operative site on the provided more cranial anterior projection image though this is not in seen on the provided lateral radiograph and thus presumably external to the patient. Stable sequela of prior right total knee replacement, incompletely evaluated. IMPRESSION: Post sideplate fixation of comminuted distal femoral metaphysis of fracture. Electronically Signed   By: Simonne Come M.D.   On: 08/13/2016 12:53   Dg Femur 1v Right  Result Date: 08/13/2016 CLINICAL DATA:  Right distal femur ORIF. EXAM: DG C-ARM 61-120 MIN; RIGHT FEMUR 1 VIEW FLUOROSCOPY TIME:  51 seconds COMPARISON:  Right femur radiographs - 08/12/2016 FINDINGS: 4 spot intraoperative fluoroscopic images of the right knee are provided for review. Images demonstrate the sequela of sideplate fixation of known comminuted distal femoral metaphysis seal fracture. The sideplate is transfixed with at least 7 cancellous screws. There is minimal residual displacement of the fracture fragments (seen best in the provided lateral radiograph) though overall there is marked improvement in alignment. Expected adjacent subcutaneous emphysema. An apparent surgical drain / sponge is seen overlying the operative site on the provided more cranial anterior projection image though this is not in seen on the provided lateral radiograph and thus presumably external to the patient. Stable sequela of prior right total knee replacement, incompletely evaluated. IMPRESSION: Post sideplate fixation of comminuted distal femoral metaphysis of fracture. Electronically Signed   By: Simonne Come M.D.   On: 08/13/2016 12:53   Dg Femur Port, Min 2 Views Right  Result Date: 08/13/2016 CLINICAL DATA:  Postop ORIF of distal right femur fracture. EXAM: RIGHT FEMUR PORTABLE 1 VIEW COMPARISON:  Radiographs 08/12/2016. Intraoperative radiographs earlier today. FINDINGS: Previous right total knee arthroplasty without hardware loosening. Interval fixation of the comminuted distal femoral fracture with a lateral plate and screws. There is near anatomic reduction of the main fracture fragments. There is a small residual posterior butterfly fragment on the lateral view. Surgical drain and skin staples are in place. The proximal tibia and fibula appear intact. IMPRESSION: Near anatomic reduction of comminuted distal femur fracture post ORIF. No demonstrated complication. Electronically Signed   By: Carey Bullocks M.D.   On: 08/13/2016 14:23    EKG:   Orders placed or performed during the hospital encounter of 08/12/16  . ED EKG  . ED EKG    ASSESSMENT AND PLAN:   #82 year old female patient admitted for distal femur fracture, seen  By ortho, and for surgery today. #2 chronic atrial fibrillation: Hold eliquis for surgery #3 diabetes mellitus type 2 continue insulin pump Essential hypertension: Controlled.    All the records are reviewed and case discussed with Care Management/Social Workerr. Management plans discussed with the patient, family and they are in agreement.  CODE STATUS:full  TOTAL TIME TAKING CARE OF THIS PATIENT: .   POSSIBLE D/C IN 1-2 DAYS, DEPENDING ON CLINICAL CONDITION.   Katha Hamming M.D on 08/14/2016 at 11:03 AM  Between 7am to 6pm - Pager - [phone]  After 6pm go to [email or web address] - password [email or web address] [facility]  [place] [facility]  Office  [phone]  CC:  Primary care physician; Keane Police, MD   Note: This dictation was prepared with Dragon dictation along with smaller phrase technology. Any transcriptional errors that  result from this process are unintentional.

## 2016-08-14 NOTE — Progress Notes (Signed)
Called to assist patient with restarting V-Go insulin pump. Patient has V-Go 20 insulin pump with Humalog insulin. The V-Go 20 provides 20 units of basal insulin and patient uses "clicks" to cover carbohydrates from meal intake. Patient removed old empty V-Go insulin pump from left abdomen. Provided minimal assistance for patient as she was able to fill new V-Go 20 insulin pump with Humalog insulin using her Easy Fill device. Once V-Go filled with insulin patient applied to right lower abdomen. Patient will need to change out V-Go insulin pump every 24 hours. Patient states that her Easy Fill device is usually stored in the refrigerator and should be sitting upright to prevent insulin from leaking out.   Patient reports that she checks her glucose 2-3 times per day and her glucose is usually in the low to mid 100's mg/dl. Patient states that she rarely has any hypoglycemia. Patient reports she uses 2 or 3 clicks with meals (each click delivers 2 units of insulin) depending on how much she eats. Explained to patient that her glucose has ranged from 109-187 mg/dl over the past 24 hours and she states that her V-Go pump likely ran out of insulin sometime yesterday. Encouraged patient to call nursing staff anytime if she feels her glucose is getting low. Patient verbalized understanding of information discussed. Patient has 2 empty V-Go pods at bedside to use for the next 2 days. Asked that patient have her daughter bring more Humalog insulin since there is not much insulin left in current Humalog vial in the Easy Fill Device.  Informed patient that her Easy Fill Device will be given to [facility], RN and will ask that patient's label be placed on the Easy Fill Device and placed in the refrigerator in the Medication room in the nursing unit. The Easy Fill Device will need to be given to patient daily to fill V-Go so new V-Go can be applied every 24 hours.   Thanks, Orlando PennerMarie Johnanthony Wilden, RN, MSN, CDE Diabetes  Coordinator Inpatient Diabetes Program [phone] (Team Pager from 8am to 5pm) [phone] ([facility] office) [phone] [facility] office) [phone] [facility] office)

## 2016-08-14 NOTE — Progress Notes (Signed)
[facility] - [place] at [facility]   PATIENT NAME: Angelica Lewis    MR#:  [id number]  DATE OF BIRTH:  08/20/1933  SUBJECTIVE: Status post ORIF of distal femur fracture yesterday. After the operation patient to remain hypotensive at least 3 hours requiring aggressive fluid resuscitation with 2 L of normal saline bolus. Today blood pressure is better. Patient will start physical therapy today.   CHIEF COMPLAINT:   Chief Complaint  Patient presents with  . Fall    REVIEW OF SYSTEMS:   Review of Systems  Musculoskeletal:       Status post repair of right femur fracture,   CONSTITUTIONAL: No fever, fatigue or weakness.  EYES: No blurred or double vision.  EARS, NOSE, AND THROAT: No tinnitus or ear pain.  RESPIRATORY: No cough, shortness of breath, wheezing or hemoptysis.  CARDIOVASCULAR: No chest pain, orthopnea, edema.  GASTROINTESTINAL: No nausea, vomiting, diarrhea or abdominal pain.  GENITOURINARY: No dysuria, hematuria.  ENDOCRINE: No polyuria, nocturia,  HEMATOLOGY: No anemia, easy bruising or bleeding SKIN: No rash or lesion. MUSCULOSKELETAL:Right leg pain.   NEUROLOGIC: No tingling, numbness, weakness.  PSYCHIATRY: No anxiety or depression.   DRUG ALLERGIES:   Allergies  Allergen Reactions  . Fentanyl     Other reaction(s): Other (See Comments) Other Reaction: cardiac arrest  . Morphine     Other reaction(s): Other (See Comments) Other Reaction: respiratory distress  . Ace Inhibitors Other (See Comments)    Unknown reaction  . Ciprofloxacin     Other reaction(s): Unknown  . Codeine Sulfate Hives  . Doxycycline     Other reaction(s): Other (See Comments) Other Reaction: skin lesions  . Levofloxacin Swelling  . Morphine Sulfate Other (See Comments)    Unknown reaction  . Oxybenzone Other (See Comments)    Unknown reaction  . Oxycodone Nausea Only  . Oxycontin [Oxycodone Hcl]   . Penicillins Other (See Comments)    Unknown  reaction  . Tape     VITALS:  Blood pressure (!) 132/45, pulse 90, temperature 99.7 F (37.6 C), temperature source Oral, resp. rate 16, height 5\' 4"  (1.626 m), weight 85.4 kg (188 lb 4.8 oz), SpO2 99 %.  PHYSICAL EXAMINATION:  GENERAL:  82 y.o.-year-old patient lying in the bed with no acute distress.  EYES: Pupils equal, round, reactive to light and accommodation. No scleral icterus. Extraocular muscles intact.  HEENT: Head atraumatic, normocephalic. Oropharynx and nasopharynx clear.  NECK:  Supple, no jugular venous distention. No thyroid enlargement, no tenderness.  LUNGS: Normal breath sounds bilaterally, no wheezing, rales,rhonchi or crepitation. No use of accessory muscles of respiration.  CARDIOVASCULAR: S1, S2 normal. No murmurs, rubs, or gallops.  ABDOMEN: Soft, nontender, nondistended. Bowel sounds present. No organomegaly or mass.  EXTREMITIES: Status post surgery of the right leg NEUROLOGIC: Cranial nerves II through XII are intact. Muscle strength 5/5 in all extremities. Sensation intact. Gait not checked.  PSYCHIATRIC: The patient is alert and oriented x 3.  SKIN: No obvious rash, lesion, or ulcer.    LABORATORY PANEL:   CBC  Recent Labs Lab 08/13/16 0615  WBC 9.2  HGB 9.6*  HCT 27.5*  PLT 341   ------------------------------------------------------------------------------------------------------------------  Chemistries   Recent Labs Lab 08/13/16 0615  NA 136  K 3.6  CL 102  CO2 26  GLUCOSE 158*  BUN 20  CREATININE 0.77  CALCIUM 8.4*  AST 22  ALT 15  ALKPHOS 89  BILITOT 0.5   ------------------------------------------------------------------------------------------------------------------  Cardiac Enzymes  Recent  Labs Lab 08/13/16 0615  TROPONINI <0.03   ------------------------------------------------------------------------------------------------------------------  RADIOLOGY:  Dg C-arm 61-120 Min  Result Date: 08/13/2016 CLINICAL  DATA:  Right distal femur ORIF. EXAM: DG C-ARM 61-120 MIN; RIGHT FEMUR 1 VIEW FLUOROSCOPY TIME:  51 seconds COMPARISON:  Right femur radiographs - 08/12/2016 FINDINGS: 4 spot intraoperative fluoroscopic images of the right knee are provided for review. Images demonstrate the sequela of sideplate fixation of known comminuted distal femoral metaphysis seal fracture. The sideplate is transfixed with at least 7 cancellous screws. There is minimal residual displacement of the fracture fragments (seen best in the provided lateral radiograph) though overall there is marked improvement in alignment. Expected adjacent subcutaneous emphysema. An apparent surgical drain / sponge is seen overlying the operative site on the provided more cranial anterior projection image though this is not in seen on the provided lateral radiograph and thus presumably external to the patient. Stable sequela of prior right total knee replacement, incompletely evaluated. IMPRESSION: Post sideplate fixation of comminuted distal femoral metaphysis of fracture. Electronically Signed   By: Simonne Come M.D.   On: 08/13/2016 12:53   Dg Femur 1v Right  Result Date: 08/13/2016 CLINICAL DATA:  Right distal femur ORIF. EXAM: DG C-ARM 61-120 MIN; RIGHT FEMUR 1 VIEW FLUOROSCOPY TIME:  51 seconds COMPARISON:  Right femur radiographs - 08/12/2016 FINDINGS: 4 spot intraoperative fluoroscopic images of the right knee are provided for review. Images demonstrate the sequela of sideplate fixation of known comminuted distal femoral metaphysis seal fracture. The sideplate is transfixed with at least 7 cancellous screws. There is minimal residual displacement of the fracture fragments (seen best in the provided lateral radiograph) though overall there is marked improvement in alignment. Expected adjacent subcutaneous emphysema. An apparent surgical drain / sponge is seen overlying the operative site on the provided more cranial anterior projection image though this  is not in seen on the provided lateral radiograph and thus presumably external to the patient. Stable sequela of prior right total knee replacement, incompletely evaluated. IMPRESSION: Post sideplate fixation of comminuted distal femoral metaphysis of fracture. Electronically Signed   By: Simonne Come M.D.   On: 08/13/2016 12:53   Dg Femur Port, Min 2 Views Right  Result Date: 08/13/2016 CLINICAL DATA:  Postop ORIF of distal right femur fracture. EXAM: RIGHT FEMUR PORTABLE 1 VIEW COMPARISON:  Radiographs 08/12/2016. Intraoperative radiographs earlier today. FINDINGS: Previous right total knee arthroplasty without hardware loosening. Interval fixation of the comminuted distal femoral fracture with a lateral plate and screws. There is near anatomic reduction of the main fracture fragments. There is a small residual posterior butterfly fragment on the lateral view. Surgical drain and skin staples are in place. The proximal tibia and fibula appear intact. IMPRESSION: Near anatomic reduction of comminuted distal femur fracture post ORIF. No demonstrated complication. Electronically Signed   By: Carey Bullocks M.D.   On: 08/13/2016 14:23    EKG:   Orders placed or performed during the hospital encounter of 08/12/16  . ED EKG  . ED EKG    ASSESSMENT AND PLAN:   #[age] year old female patient admitted for distal femur fractur(closed), status post ORIF yesterday. Start Physical therapy. Postop hypotension: EBL is minimal. Likely secondary to anesthesia. Required aggressive hydration. Blood pressure better. Unable to give a sotalol, Cardizem t because of hypotension. We will resume when the blood pressure permits.  #2 chronic atrial fibrillation: resume eliquis   #3 diabetes mellitus type 2 continue insulin pump #4/ hypotension improved. #5 white supracondylar humerus fracture:  All the records are reviewed and case discussed with Care Management/Social Workerr. Management plans discussed with  the patient, family and they are in agreement.  CODE STATUS:full  TOTAL TIME TAKING CARE OF THIS PATIENT: 35minutes.   POSSIBLE D/C IN 1-2 DAYS, DEPENDING ON CLINICAL CONDITION.   Katha HammingKONIDENA,Gene Glazebrook M.D on 08/14/2016 at 11:05 AM  Between 7am to 6pm - Pager - [phone]  After 6pm go to [email or web address] - password [email or web address] [facility]  [facility]  Office  [phone]  CC: Primary care physician; Keane PoliceNOORANI, SEZMIN S, MD   Note: This dictation was prepared with Dragon dictation along with smaller phrase technology. Any transcriptional errors that result from this process are unintentional.

## 2016-08-14 NOTE — Progress Notes (Signed)
Patient is from [facility] for short term rehab. Per [facility] liasion patient can return to [facility] when stable. Clinical Social Worker (CSW) met with patient today and her friend Legrand Rams was at bedside. Per patient she is from [facility] and would like to return to [facility]. Plan is for patient to D/C to [facility] when stable. CSW will continue to follow and assist as needed.   [employer], LCSW [phone]

## 2016-08-15 LAB — PREPARE RBC (CROSSMATCH)

## 2016-08-15 LAB — GLUCOSE, CAPILLARY
GLUCOSE-CAPILLARY: 254 mg/dL — AB (ref 65–99)
Glucose-Capillary: 158 mg/dL — ABNORMAL HIGH (ref 65–99)
Glucose-Capillary: 155 mg/dL — ABNORMAL HIGH (ref 65–99)
Glucose-Capillary: 141 mg/dL — ABNORMAL HIGH (ref 65–99)
Glucose-Capillary: 221 mg/dL — ABNORMAL HIGH (ref 65–99)
Glucose-Capillary: 178 mg/dL — ABNORMAL HIGH (ref 65–99)

## 2016-08-15 LAB — CBC
HCT: 18.3 % — ABNORMAL LOW (ref 35.0–47.0)
HEMOGLOBIN: 6.3 g/dL — AB (ref 12.0–16.0)
MCH: 31.8 pg (ref 26.0–34.0)
MCHC: 34.5 g/dL (ref 32.0–36.0)
MCV: 92.2 fL (ref 80.0–100.0)
Platelets: 282 10*3/uL (ref 150–440)
RBC: 1.99 MIL/uL — AB (ref 3.80–5.20)
RDW: 14.3 % (ref 11.5–14.5)
WBC: 8.2 10*3/uL (ref 3.6–11.0)

## 2016-08-15 MED ORDER — SENNOSIDES-DOCUSATE SODIUM 8.6-50 MG PO TABS
1.0000 | ORAL_TABLET | Freq: Two times a day (BID) | ORAL | Status: DC
  Administered 2016-08-15 – 2016-08-17 (×4): 1 via ORAL
  Filled 2016-08-15 (×4): qty 1

## 2016-08-15 MED ORDER — DILTIAZEM HCL 30 MG PO TABS
30.0000 mg | ORAL_TABLET | Freq: Four times a day (QID) | ORAL | Status: DC
  Administered 2016-08-15 – 2016-08-17 (×9): 30 mg via ORAL
  Filled 2016-08-15 (×10): qty 1

## 2016-08-15 MED ORDER — SODIUM CHLORIDE 0.9 % IV SOLN: Freq: Once | INTRAVENOUS | Status: DC

## 2016-08-15 MED ORDER — SOTALOL HCL 80 MG PO TABS
160.0000 mg | ORAL_TABLET | Freq: Two times a day (BID) | ORAL | Status: DC
  Administered 2016-08-15 – 2016-08-17 (×5): 160 mg via ORAL
  Filled 2016-08-15 (×5): qty 2

## 2016-08-15 NOTE — Progress Notes (Signed)
Physical Therapy Treatment Patient Details Name: Angelica Lewis MRN: [id number] DOB: [birth year] Today's Date: 08/15/2016    History of Present Illness Pt is an 82 yo F who fell around 08/01/16 resulting in R humeral fracture.  [facility] and had another fall resulting in a distal R femur fracture and is s/p ORIF.    PT Comments    Per discussion with PTA, patient with noted improvement in medical status and overall ability to participate with PT session this AM.  Will benefit from increase in treatment frequency throughout remaining hospitalization.  Will increase treatment frequency to BID (vs. QD on initial evaluation); goals and discharge recommendations established on initial evaluation remain appropriate.  Will continue to monitor ability to participate/progress as appropriate.     Follow Up Recommendations  SNF           Precautions / Restrictions Precautions Precautions: Fall Required Braces or Orthoses: Knee Immobilizer - Right Knee Immobilizer - Right: On at all times Restrictions Weight Bearing Restrictions: Yes RUE Weight Bearing: Non weight bearing RLE Weight Bearing: Non weight bearing     PT Goals (current goals can now be found in the care plan section) Acute Rehab PT Goals Patient Stated Goal: Get up and go to the bathroom by myself PT Goal Formulation: With patient Time For Goal Achievement: 08/27/16 Potential to Achieve Goals: Fair    Frequency  Angelica Lewis, PT, DPT, NCS 08/15/16, 11:12 AM [phone]     BID

## 2016-08-15 NOTE — Progress Notes (Signed)
Physical Therapy Treatment Patient Details Name: Angelica Lewis MRN: [id number] DOB: 01/04/1933 Today's Date: 08/15/2016    History of Present Illness Pt is an 82 yo F who fell around 08/01/16 resulting in R humeral fracture.  Pt was at [facility] and had another fall resulting in a distal R femur fracture and is s/p ORIF.    PT Comments    Pt agreeable to PT this afternoon. Pt progressing functional mobility with good effort to sitting edge of bed. Requires Mod to Max A for sit and Mod A to return to supine with Max A of 2 to reposition upward in bed. Pt notes sitting once in upright position and able to maintain felt much better on her back. Pt does becomes nauseated with dry heaves in sit and returned to bed once ceased. Continue PT to improve strength, endurance to improve all functional mobility safely.   Follow Up Recommendations  SNF     Equipment Recommendations  Wheelchair (measurements PT) (possibly)    Recommendations for Other Services       Precautions / Restrictions Precautions Precautions: Fall Required Braces or Orthoses: Knee Immobilizer - Right Knee Immobilizer - Right: On at all times Restrictions Weight Bearing Restrictions: Yes RUE Weight Bearing: Non weight bearing RLE Weight Bearing: Non weight bearing    Mobility  Bed Mobility Overal bed mobility: Needs Assistance Bed Mobility: Supine to Sit;Sit to Supine     Supine to sit: Mod assist;Max assist;HOB elevated Sit to supine: Mod assist   General bed mobility comments: Pt demonstrates good effort initially requiring Mod A; increased assist to Max for full upright due to pain in RLE and inabililty to rest on floor. Once RLE rested on pillows for support pain able to shift weight R and sit without support. Pt becomes nauseated with several dry heeves once sitting. Tolerates sit for 6 minutes  Transfers                    Ambulation/Gait                 Stairs             Wheelchair Mobility    Modified Rankin (Stroke Patients Only)       Balance                                    Cognition Arousal/Alertness: Awake/alert Behavior During Therapy: WFL for tasks assessed/performed Overall Cognitive Status: Within Functional Limits for tasks assessed                      Exercises      General Comments        Pertinent Vitals/Pain Pain Assessment: 0-10 Pain Score: 10-Worst pain ever Pain Location: R thigh with movement Pain Intervention(s): Monitored during session;Repositioned    Home Living                      Prior Function            PT Goals (current goals can now be found in the care plan section) Acute Rehab PT Goals Patient Stated Goal: Get up and go to the bathroom by myself PT Goal Formulation: With patient Time For Goal Achievement: 08/27/16 Potential to Achieve Goals: Fair Progress towards PT goals: Progressing toward goals    Frequency    BID  PT Plan Current plan remains appropriate    Co-evaluation             End of Session   Activity Tolerance: Patient limited by fatigue;Patient limited by pain;Other (comment) (Nausea) Patient left: in bed;with call bell/phone within reach;with bed alarm set;with family/visitor present;with SCD's reapplied;Other (comment) (CP to L shoulder due to sore IV site; nsg previously applied)     Time: 1610-9604 PT Time Calculation (min) (ACUTE ONLY): 26 min  Charges:  $Therapeutic Activity: 23-37 mins                    G CodesScot Dock, PTA 08/15/2016, 2:32 PM

## 2016-08-15 NOTE — Progress Notes (Signed)
Checked to see if pre-authorization would be needed for non-emergent EMS transport. Per [employer] benefits obtained online through [employer], patient has a [employer] Medicare Advantage PPO policy.  Medicare PPO plans do not require pre-auth for non-emergent ground transports using service codes A0426 or A0428.   

## 2016-08-15 NOTE — Care Management Important Message (Signed)
Important Message  Patient Details  Name: Meriel FlavorsMartha Summey MRN: [id number] Date of Birth: Jan 18, 1933   Medicare Important Message Given:  Yes    Collie SiadAngela Dino Borntreger, RN 08/15/2016, 8:03 AM

## 2016-08-15 NOTE — Progress Notes (Signed)
Berton BonJanine Hammond, NP, stated to order sotalol 160 mg bid.

## 2016-08-15 NOTE — Progress Notes (Signed)
Subjective: 2 Days Post-Op Procedure(s) (LRB): OPEN REDUCTION INTERNAL FIXATION (ORIF) DISTAL FEMUR FRACTURE (Right)    Patient reports pain as mild. Feeling well.  csm good.    Objective:   VITALS:   Vitals:   08/15/16 0401 08/15/16 0724  BP: (!) 124/45 123/60  Pulse: 96 (!) 109  Resp: 16 17  Temp: 99.8 F (37.7 C) 99.4 F (37.4 C)    Neurologically intact ABD soft Neurovascular intact Sensation intact distally Intact pulses distally Dorsiflexion/Plantar flexion intact Incision: no drainage  LABS  Recent Labs  08/13/16 0615  HGB 9.6*  HCT 27.5*  WBC 9.2  PLT 341     Recent Labs  08/13/16 0615  NA 136  K 3.6  BUN 20  CREATININE 0.77  GLUCOSE 158*    No results for input(s): LABPT, INR in the last 72 hours.   Assessment/Plan: 2 Days Post-Op Procedure(s) (LRB): OPEN REDUCTION INTERNAL FIXATION (ORIF) DISTAL FEMUR FRACTURE (Right)   Up with therapy Discharge to SNF

## 2016-08-15 NOTE — Progress Notes (Signed)
Dr. Luberta MutterKonidena notified of infiltrated iv site. Stated to d/c iv fluids.

## 2016-08-15 NOTE — Progress Notes (Addendum)
[facility] - [place] at [facility]   PATIENT NAME: Angelica FlavorsMartha Lewis    MR#:  [id number]  DATE OF BIRTH:  1933-08-06  SUBJECTIVE;overall improving but became tachycardic in the  Evening,and also found to have  Anemia with hb drop to 6.  CHIEF COMPLAINT:   Chief Complaint  Patient presents with  . Fall    REVIEW OF SYSTEMS:   ROS CONSTITUTIONAL: No fever, fatigue or weakness.  EYES: No blurred or double vision.  EARS, NOSE, AND THROAT: No tinnitus or ear pain.  RESPIRATORY: No cough, shortness of breath, wheezing or hemoptysis.  CARDIOVASCULAR: No chest pain, orthopnea, edema.  GASTROINTESTINAL: No nausea, vomiting, diarrhea or abdominal pain.  GENITOURINARY: No dysuria, hematuria.  ENDOCRINE: No polyuria, nocturia,  HEMATOLOGY: No anemia, easy bruising or bleeding SKIN: No rash or lesion. MUSCULOSKELETAL:Right leg pain.   NEUROLOGIC: No tingling, numbness, weakness.  PSYCHIATRY: No anxiety or depression.   DRUG ALLERGIES:   Allergies  Allergen Reactions  . Fentanyl     Other reaction(s): Other (See Comments) Other Reaction: cardiac arrest  . Morphine     Other reaction(s): Other (See Comments) Other Reaction: respiratory distress  . Ace Inhibitors Other (See Comments)    Unknown reaction  . Ciprofloxacin     Other reaction(s): Unknown  . Codeine Sulfate Hives  . Doxycycline     Other reaction(s): Other (See Comments) Other Reaction: skin lesions  . Levofloxacin Swelling  . Morphine Sulfate Other (See Comments)    Unknown reaction  . Oxybenzone Other (See Comments)    Unknown reaction  . Oxycodone Nausea Only  . Oxycontin [Oxycodone Hcl]   . Penicillins Other (See Comments)    Unknown reaction  . Tape     VITALS:  Blood pressure (!) 122/58, pulse 88, temperature 98.1 F (36.7 C), temperature source Oral, resp. rate 19, height 5\' 4"  (1.626 m), weight 92.6 kg (204 lb 3.6 oz), SpO2 96 %.  PHYSICAL EXAMINATION:  GENERAL:  82  y.o.-year-old patient lying in the bed with no acute distress.  EYES: Pupils equal, round, reactive to light and accommodation. No scleral icterus. Extraocular muscles intact.  HEENT: Head atraumatic, normocephalic. Oropharynx and nasopharynx clear.  NECK:  Supple, no jugular venous distention. No thyroid enlargement, no tenderness.  LUNGS: Normal breath sounds bilaterally, no wheezing, rales,rhonchi or crepitation. No use of accessory muscles of respiration.  CARDIOVASCULAR: S1, S2 normal. No murmurs, rubs, or gallops.  ABDOMEN: Soft, nontender, nondistended. Bowel sounds present. No organomegaly or mass.  EXTREMITIES: Mobilized the right leg NEUROLOGIC: Cranial nerves II through XII are intact. Muscle strength 5/5 in all extremities. Sensation intact. Gait not checked.  PSYCHIATRIC: The patient is alert and oriented x 3.  SKIN: No obvious rash, lesion, or ulcer.    LABORATORY PANEL:   CBC  Recent Labs Lab 08/15/16 1706  WBC 8.2  HGB 6.3*  HCT 18.3*  PLT 282   ------------------------------------------------------------------------------------------------------------------  Chemistries   Recent Labs Lab 08/13/16 0615  NA 136  K 3.6  CL 102  CO2 26  GLUCOSE 158*  BUN 20  CREATININE 0.77  CALCIUM 8.4*  AST 22  ALT 15  ALKPHOS 89  BILITOT 0.5   ------------------------------------------------------------------------------------------------------------------  Cardiac Enzymes  Recent Labs Lab 08/13/16 0615  TROPONINI <0.03   ------------------------------------------------------------------------------------------------------------------  RADIOLOGY:  No results found.  EKG:   Orders placed or performed during the hospital encounter of 08/12/16  . ED EKG  . ED EKG    ASSESSMENT AND PLAN:   #  82 year old female patient admitted for distal femur fracture s/p repair Seen by ortho.continue PT 2. Acute blood loss anemia after surgery ;;hb droped from 9.6 to  6.3, will get 2 units PRBC transfusion.  #2 chronic atrial fibrillation:with tachycardia;cardizem restarted 30 mg po q 6 hrs,continue sotalol..continue  Eliquis   #3 diabetes mellitus type 2 continue insulin pump Essential hypertension: Controlled. D/w RN and pt.   All the records are reviewed and case discussed with Care Management/Social Workerr. Management plans discussed with the patient, family and they are in agreement.  CODE STATUS:full  TOTAL TIME TAKING CARE OF THIS PATIENT: .   POSSIBLE D/C IN 1-2 DAYS, DEPENDING ON CLINICAL CONDITION.   Katha Hamming M.D on 08/15/2016 at 8:32 PM  Between 7am to 6pm - Pager - [phone]  After 6pm go to [email or web address] - password [email or web address] [facility]  [facility]  Office  [phone]  CC: Primary care physician; Keane Police, MD   Note: This dictation was prepared with Dragon dictation along with smaller phrase technology. Any transcriptional errors that result from this process are unintentional.

## 2016-08-15 NOTE — Progress Notes (Signed)
Physical Therapy Treatment Patient Details Name: Angelica FlavorsMartha Lewis MRN: [id number] DOB: [birth year] Today's Date: 08/15/2016    History of Present Illness Pt is an 82 yo F who fell around 08/01/16 resulting in R humeral fracture.  Pt was at [facility] and had another fall resulting in a distal R femur fracture and is s/p ORIF.    PT Comments    Pt agreeable and eager for PT. Pt notes 4/10 pain in R body, shoulder and hip/thigh, but notes back sore from being in bed. Pt eager to sit edge of bed; timid and unsure of getting to chair. Discussion with pt/daughter on possibilities for transfer to chair. Pt does participate well with assist as needed with supine exercises. Initiated scoot to edge of bed with verbal cueing and Mod A to scoot hips; pt does show good effort with minimal bridge with Left lower extremity and able to scoot upper body in small increments without physical assist. During bed mobility work, pt notes urge to have a bowel movement and wishes to get on bed pan; nursing staff contacted. Discussed benefit of PT twice daily to allow for progression of endurance, strength and mobility up/out of bed as pt demonstrating desire to move complete cognitive awareness of situation. Discussed changing frequency with PT team manager. Plan to see pt this afternoon for continued progress and improved functional mobility.   Follow Up Recommendations  SNF     Equipment Recommendations  Wheelchair (measurements PT) (potentially; daughter notes rehab can only go through 10/13/)    Recommendations for Other Services       Precautions / Restrictions Precautions Precautions: Fall Required Braces or Orthoses: Knee Immobilizer - Right Knee Immobilizer - Right: On at all times Restrictions Weight Bearing Restrictions: Yes RUE Weight Bearing: Non weight bearing RLE Weight Bearing: Non weight bearing    Mobility  Bed Mobility               General bed mobility comments: Initiated with  cues for min bridge/scoot and upper body scoot to edge of bed with Mod A using chuck pad. Pt notes need to have a bowel movement at this point; nsg contacted  Transfers                    Ambulation/Gait             General Gait Details: Unable due to NWB RUE and RLE extremities   Stairs            Wheelchair Mobility    Modified Rankin (Stroke Patients Only)       Balance                                    Cognition Arousal/Alertness: Awake/alert Behavior During Therapy: WFL for tasks assessed/performed Overall Cognitive Status: Within Functional Limits for tasks assessed                      Exercises General Exercises - Lower Extremity Ankle Circles/Pumps: AROM;Both;20 reps;Supine (decreased range R due to prior foot drop) Quad Sets: Strengthening;Both;20 reps;Supine Gluteal Sets: Strengthening;Both;20 reps;Supine Short Arc Quad: AROM;Left;20 reps;Supine Heel Slides: AROM;Left;20 reps;Supine Hip ABduction/ADduction: AAROM;10 reps;Supine;Other (comment);Both (2 sets)    General Comments        Pertinent Vitals/Pain Pain Assessment: 0-10 Pain Score: 4  Pain Location: R hip/thigh and R shoulder/ Pain Intervention(s): Limited activity within patient's tolerance;Monitored  during session;Premedicated before session    Home Living                      Prior Function            PT Goals (current goals can now be found in the care plan section) Acute Rehab PT Goals Patient Stated Goal: Get up and go to the bathroom by myself PT Goal Formulation: With patient Time For Goal Achievement: 08/27/16 Potential to Achieve Goals: Fair Progress towards PT goals: Progressing toward goals (slowly)    Frequency    BID      PT Plan Frequency needs to be updated    Co-evaluation             End of Session   Activity Tolerance: Patient tolerated treatment well;Other (comment) (Need to use bed pan) Patient  left: in bed;with family/visitor present;with nursing/sitter in room     Time: 1005-1036 PT Time Calculation (min) (ACUTE ONLY): 31 min  Charges:  $Therapeutic Exercise: 8-22 mins $Therapeutic Activity: 8-22 mins                    G Codes:      Scot Dock, PTA 08/15/2016, 12:00 PM

## 2016-08-15 NOTE — Progress Notes (Signed)
SUBJECTIVE: [name] is feeling groggy this morning, but denies any chest pain, palpitations or shortness of breath.   Vitals:   08/14/16 1931 08/15/16 0401 08/15/16 0500 08/15/16 0724  BP: (!) 104/44 (!) 124/45  123/60  Pulse: 96 96  (!) 109  Resp: 16 16  17   Temp: 99.2 F (37.3 C) 99.8 F (37.7 C)  99.4 F (37.4 C)  TempSrc: Oral Oral  Oral  SpO2: 98% 98%  96%  Weight:   204 lb 3.6 oz (92.6 kg)   Height:        Intake/Output Summary (Last 24 hours) at 08/15/16 0828 Last data filed at 08/14/16 1410  Gross per 24 hour  Intake              480 ml  Output              150 ml  Net              330 ml    LABS: Basic Metabolic Panel:  Recent Labs  16/08/9608/30/17 0950 08/13/16 0615  NA 142 136  K 3.3* 3.6  CL 106 102  CO2 27 26  GLUCOSE 106* 158*  BUN 17 20  CREATININE 0.69 0.77  CALCIUM 9.4 8.4*   Liver Function Tests:  Recent Labs  08/12/16 0950 08/13/16 0615  AST 23 22  ALT 19 15  ALKPHOS 99 89  BILITOT 0.9 0.5  PROT 8.1 6.7  ALBUMIN 3.9 3.1*   No results for input(s): LIPASE, AMYLASE in the last 72 hours. CBC:  Recent Labs  08/12/16 0950 08/13/16 0615  WBC 10.3 9.2  NEUTROABS 7.5*  --   HGB 11.8* 9.6*  HCT 33.2* 27.5*  MCV 91.9 91.2  PLT 392 341   Cardiac Enzymes:  Recent Labs  08/12/16 1839 08/13/16 0001 08/13/16 0615  TROPONINI <0.03 <0.03 <0.03   BNP: Invalid input(s): POCBNP D-Dimer: No results for input(s): DDIMER in the last 72 hours. Hemoglobin A1C: No results for input(s): HGBA1C in the last 72 hours. Fasting Lipid Panel: No results for input(s): CHOL, HDL, LDLCALC, TRIG, CHOLHDL, LDLDIRECT in the last 72 hours. Thyroid Function Tests:  Recent Labs  08/12/16 1839  TSH 0.828   Anemia Panel: No results for input(s): VITAMINB12, FOLATE, FERRITIN, TIBC, IRON, RETICCTPCT in the last 72 hours.   PHYSICAL EXAM General: Well developed, well nourished, in no acute distress HEENT:  Normocephalic and atramatic Neck:  No  JVD.  Lungs: Clear bilaterally to auscultation and percussion. Heart: HRRR .Tachycardic Normal S1 and S2 without gallops or murmurs.  Abdomen: Bowel sounds are positive, abdomen soft and non-tender  Msk: Surgical dressing right hip and immobilizer to right upper extremity Extremities: No clubbing, cyanosis or edema.   Neuro: Alert and oriented X 3. Psych:  Good affect, responds appropriately  TELEMETRY: Was taken off telemetry  ASSESSMENT AND PLAN: Postsurgical patient with history of atrial fibrillation that was being treated with sotalol and diltiazem and is now off of those medicines due to hypotension. This morning she is apparently off telemetry due to maintenance of normal sinus rhythm and is mildly tachycardic on exam. Her blood pressure has improved and so we'll add low-dose diltiazem back for heart rate control with parameters to hold for hypotension. She is back on her Eliquis for stroke risk reduction and will reorder telemetry to monitor for tachycardia and possible return to atrial fibrillation.  Principal Problem:   Closed disp comminuted fracture of shaft of right femur with malunion Active  Problems:   Right supracondylar humerus fracture   PAF (paroxysmal atrial fibrillation) (HCC)   Diabetes mellitus without complication (HCC)   Femur fracture, right (HCC)   Hypovolemia    Berton Bon, NP 08/15/2016 8:28 AM

## 2016-08-16 LAB — CBC
HCT: 26.3 % — ABNORMAL LOW (ref 35.0–47.0)
Hemoglobin: 9 g/dL — ABNORMAL LOW (ref 12.0–16.0)
MCH: 30.1 pg (ref 26.0–34.0)
MCHC: 34.2 g/dL (ref 32.0–36.0)
MCV: 88.1 fL (ref 80.0–100.0)
PLATELETS: 307 10*3/uL (ref 150–440)
RBC: 2.99 MIL/uL — ABNORMAL LOW (ref 3.80–5.20)
RDW: 16.7 % — AB (ref 11.5–14.5)
WBC: 8.3 10*3/uL (ref 3.6–11.0)

## 2016-08-16 LAB — TYPE AND SCREEN
ABO/RH(D): A POS
Antibody Screen: NEGATIVE
UNIT DIVISION: 0
UNIT DIVISION: 0

## 2016-08-16 LAB — GLUCOSE, CAPILLARY
Glucose-Capillary: 151 mg/dL — ABNORMAL HIGH (ref 65–99)
Glucose-Capillary: 166 mg/dL — ABNORMAL HIGH (ref 65–99)
Glucose-Capillary: 176 mg/dL — ABNORMAL HIGH (ref 65–99)
Glucose-Capillary: 226 mg/dL — ABNORMAL HIGH (ref 65–99)
Glucose-Capillary: 220 mg/dL — ABNORMAL HIGH (ref 65–99)
Glucose-Capillary: 179 mg/dL — ABNORMAL HIGH (ref 65–99)

## 2016-08-16 LAB — CREATININE, SERUM
Creatinine, Ser: 0.74 mg/dL (ref 0.44–1.00)
GFR calc non Af Amer: >60 mL/min (ref >60)

## 2016-08-16 NOTE — Progress Notes (Signed)
Per MD note patient will likely be ready for D/C in the next 1-2 days. Plan is for patient to return to [facility]. [facility] liaision is aware of above. Clinical Social Worker (CSW) will continue to follow and assist as needed.   Baker Hughes IncorporatedBailey Mackena Plummer, LCSW [phone]

## 2016-08-16 NOTE — Progress Notes (Signed)
[facility] - [place] at [facility]   PATIENT NAME: Angelica FlavorsMartha Lewis    MR#:  [id number]  DATE OF BIRTH:  [birth year]  SUBJECTIVE; history and seen today. Had atrial fibrillation heart rate up to 140, anemia yesterday. Did add on sotalol, Cardizem. Received 2 units of transfusion. Today she feels better, hemoglobin improved from 6.3to 9.  CHIEF COMPLAINT:   Chief Complaint  Patient presents with  . Fall    REVIEW OF SYSTEMS:   ROS CONSTITUTIONAL: No fever, fatigue or weakness.  EYES: No blurred or double vision.  EARS, NOSE, AND THROAT: No tinnitus or ear pain.  RESPIRATORY: No cough, shortness of breath, wheezing or hemoptysis.  CARDIOVASCULAR: No chest pain, orthopnea, edema.  GASTROINTESTINAL: No nausea, vomiting, diarrhea or abdominal pain.  GENITOURINARY: No dysuria, hematuria.  ENDOCRINE: No polyuria, nocturia,  HEMATOLOGY: No anemia, easy bruising or bleeding SKIN: No rash or lesion. MUSCULOSKELETAL:Right leg pain.   NEUROLOGIC: No tingling, numbness, weakness.  PSYCHIATRY: No anxiety or depression.   DRUG ALLERGIES:   Allergies  Allergen Reactions  . Fentanyl     Other reaction(s): Other (See Comments) Other Reaction: cardiac arrest  . Morphine     Other reaction(s): Other (See Comments) Other Reaction: respiratory distress  . Ace Inhibitors Other (See Comments)    Unknown reaction  . Ciprofloxacin     Other reaction(s): Unknown  . Codeine Sulfate Hives  . Doxycycline     Other reaction(s): Other (See Comments) Other Reaction: skin lesions  . Levofloxacin Swelling  . Morphine Sulfate Other (See Comments)    Unknown reaction  . Oxybenzone Other (See Comments)    Unknown reaction  . Oxycodone Nausea Only  . Oxycontin [Oxycodone Hcl]   . Penicillins Other (See Comments)    Unknown reaction  . Tape     VITALS:  Blood pressure 137/60, pulse (!) 101, temperature 98.2 F (36.8 C), temperature source Oral, resp. rate 18, height 5'  4" (1.626 m), weight 94.8 kg (208 lb 15 oz), SpO2 97 %.  PHYSICAL EXAMINATION:  GENERAL:  82 y.o.-year-old patient lying in the bed with no acute distress.  EYES: Pupils equal, round, reactive to light and accommodation. No scleral icterus. Extraocular muscles intact.  HEENT: Head atraumatic, normocephalic. Oropharynx and nasopharynx clear.  NECK:  Supple, no jugular venous distention. No thyroid enlargement, no tenderness.  LUNGS: Normal breath sounds bilaterally, no wheezing, rales,rhonchi or crepitation. No use of accessory muscles of respiration.  CARDIOVASCULAR: S1, S2 normal. No murmurs, rubs, or gallops.  ABDOMEN: Soft, nontender, nondistended. Bowel sounds present. No organomegaly or mass.  EXTREMITIES: right leg immobilized. NEUROLOGIC: Cranial nerves II through XII are intact. Muscle strength 5/5 in all extremities. Sensation intact. Gait not checked.  PSYCHIATRIC: The patient is alert and oriented x 3.  SKIN: No obvious rash, lesion, or ulcer.    LABORATORY PANEL:   CBC  Recent Labs Lab 08/16/16 0557  WBC 8.3  HGB 9.0*  HCT 26.3*  PLT 307   ------------------------------------------------------------------------------------------------------------------  Chemistries   Recent Labs Lab 08/13/16 0615 08/16/16 0557  NA 136  --   K 3.6  --   CL 102  --   CO2 26  --   GLUCOSE 158*  --   BUN 20  --   CREATININE 0.77 0.74  CALCIUM 8.4*  --   AST 22  --   ALT 15  --   ALKPHOS 89  --   BILITOT 0.5  --    ------------------------------------------------------------------------------------------------------------------  Cardiac Enzymes  Recent Labs Lab 08/13/16 0615  TROPONINI <0.03   ------------------------------------------------------------------------------------------------------------------  RADIOLOGY:  No results found.  EKG:   Orders placed or performed during the hospital encounter of 08/12/16  . ED EKG  . ED EKG    ASSESSMENT AND PLAN:    #23.82 year old female patient admitted for distal femur fracture, s/p  Repair.; Encouraged  To participate in physical therapy, out of bed   2. Acute blood loss anemial;hb droped from 9.6 to 6.3.received  2 units PRBC transfusion.  Hb back up at 9.  #2 chronic atrial fibrillation:with tachycardia;cardizem restarted 30 mg po q 6 hrs,continue sotalol.continue Eliquis.   #3 diabetes mellitus type 2 continue insulin pump Essential hypertension: Controlled. D/w RN and pt. Follow-up monitor today on telemetry for further improvement, stabilization of the heart rate. Likely discharge to [facility] tomorrow about discussed with patient's daughter, registered nurse  All the records are reviewed and case discussed with Care Management/Social Workerr. Management plans discussed with the patient, family and they are in agreement.  CODE STATUS:full  TOTAL TIME TAKING CARE OF THIS PATIENT: .   POSSIBLE D/C IN 1-2 DAYS, DEPENDING ON CLINICAL CONDITION.   Katha Hamming M.D on 08/16/2016 at 12:52 PM  Between 7am to 6pm - Pager - [phone]  After 6pm go to [email or web address] - password [email or web address] [facility]  [facility]  Office  [phone]  CC: Primary care physician; Keane Police, MD   Note: This dictation was prepared with Dragon dictation along with smaller phrase technology. Any transcriptional errors that result from this process are unintentional.

## 2016-08-16 NOTE — Progress Notes (Signed)
Physical Therapy Treatment Patient Details Name: Angelica Lewis MRN: [id number] DOB: 10-20-1933 Today's Date: 08/16/2016    History of Present Illness Pt is an 82 yo F who fell around 08/01/16 resulting in R humeral fracture.  Pt was at [facility] and had another fall resulting in a distal R femur fracture and is s/p ORIF.    PT Comments    Pt's nurse called earlier noting pt wished back to bed. Pt returned to bed via slide board transfer with lateral scoot to the left. Required 2 attempts due to pt pushing into extension versus sitting with transfer. Re instructed pt on importance of maintaining sitting posture for safety of transfer. Pt returned to supine and repositioned in bed with Max of 2. Pt notes significant fatigue. Pt commended on increased work/activity today and encouraged later in day to perform exercises as instructed previously; daughter present as well and instructed on how to assist as needed. Continue PT to progress strength, endurance and transfers to improve functional mobility.   Follow Up Recommendations  SNF     Equipment Recommendations       Recommendations for Other Services       Precautions / Restrictions Precautions Precautions: Fall Required Braces or Orthoses: Knee Immobilizer - Right Knee Immobilizer - Right: On at all times Restrictions Weight Bearing Restrictions: Yes RUE Weight Bearing: Non weight bearing RLE Weight Bearing: Non weight bearing    Mobility  Bed Mobility Overal bed mobility: Needs Assistance Bed Mobility: Sit to Supine     Supine to sit: Mod assist     General bed mobility comments: Assist for trunk and LEs. Max A to reposition upward in bed  Transfers Overall transfer level: Needs assistance Equipment used:  (slideboard) Transfers: Lateral/Scoot Transfers          Lateral/Scoot Transfers: +2 physical assistance;Total assist General transfer comment: Scoot to the L chair to bed. Pt needs increased instruction  on keeping body position in sit versus extension to facilitate transfer rather than inhibit. Required 2 attempts   Ambulation/Gait             General Gait Details: Unable   Stairs            Wheelchair Mobility    Modified Rankin (Stroke Patients Only)       Balance                                    Cognition Arousal/Alertness: Awake/alert (but fatigued) Behavior During Therapy: WFL for tasks assessed/performed Overall Cognitive Status: Within Functional Limits for tasks assessed                      Exercises      General Comments        Pertinent Vitals/Pain Pain Assessment: 0-10 Pain Score: 4  Pain Location: R shoulder/hip/thigh Pain Intervention(s): Monitored during session;Limited activity within patient's tolerance    Home Living                      Prior Function            PT Goals (current goals can now be found in the care plan section) Progress towards PT goals: Progressing toward goals    Frequency    BID      PT Plan Current plan remains appropriate    Co-evaluation  End of Session Equipment Utilized During Treatment: Right knee immobilizer Activity Tolerance: Patient tolerated treatment well Patient left: with call bell/phone within reach;with family/visitor present;in bed;with bed alarm set     Time: 4782-9562 PT Time Calculation (min) (ACUTE ONLY): 12 min  Charges:  $Therapeutic Activity: 8-22 mins                    G CodesScot Dock, PTA 08/16/2016, 3:06 PM

## 2016-08-16 NOTE — Progress Notes (Signed)
Physical Therapy Treatment Patient Details Name: Angelica FlavorsMartha Wesely MRN: [id number] DOB: Oct 04, 1933 Today's Date: 08/16/2016    History of Present Illness Pt is an 82 yo F who fell around 08/01/16 resulting in R humeral fracture.  Pt was at [facility] and had another fall resulting in a distal R femur fracture and is s/p ORIF.    PT Comments    Pt agreeable to PT; reports feeling a little better now that her typical medications are 'figured out". Pt notes pain is manageable today. Pt demonstrates improved ability to sit today requiring Min A primarily for Right lower extremity; pt does require increased time. Right lower extremity needs to be held up for pt over edge of bed or propped to avoid increased pain. Pt timid, but agreeable with encouragement to perform slide board transfer to get pt in chair. Total assist of 2 for transfer; pt is pleased up in chair and notes improved comfort on her back. Pt encouraged to remain up in chair until afternoon session. Continue PT to progress strength, endurance to improve functional mobility.   Follow Up Recommendations  SNF     Equipment Recommendations       Recommendations for Other Services       Precautions / Restrictions Precautions Precautions: Fall Required Braces or Orthoses: Knee Immobilizer - Right Knee Immobilizer - Right: On at all times Restrictions Weight Bearing Restrictions: Yes RUE Weight Bearing: Non weight bearing RLE Weight Bearing: Non weight bearing    Mobility  Bed Mobility Overal bed mobility: Needs Assistance Bed Mobility: Supine to Sit     Supine to sit: Min assist     General bed mobility comments: improved effort/ability today with increased time. primarily assist for RLE.   Transfers Overall transfer level: Needs assistance Equipment used:  (slideboard) Transfers: Lateral/Scoot Transfers          Lateral/Scoot Transfers: +2 physical assistance;Total assist General transfer comment: Scoot to  the R due to the fact the arm rest only drops on L side of chair. Pt NWB RUE/LE, so unable to assist. Slid current chuck pad under pt over board to chair. Pt timid requiring cues for proper upper body placement to facilitate transfer  Ambulation/Gait             General Gait Details: Unable   Stairs            Wheelchair Mobility    Modified Rankin (Stroke Patients Only)       Balance                                    Cognition Arousal/Alertness: Awake/alert Behavior During Therapy: WFL for tasks assessed/performed Overall Cognitive Status: Within Functional Limits for tasks assessed                      Exercises General Exercises - Lower Extremity Ankle Circles/Pumps: AROM;Both;20 reps (long sit) Quad Sets: Strengthening;Both;10 reps (long sit) Gluteal Sets: Strengthening;Both;10 reps (long sit) Long Arc Quad: AROM;Left;10 reps;Seated Hip ABduction/ADduction: AAROM;Right;10 reps;Supine Hip Flexion/Marching: AROM;Left;10 reps;Supine    General Comments        Pertinent Vitals/Pain Pain Assessment: 0-10 Pain Score: 4  Pain Location: R shoulder/hip/thigh Pain Intervention(s): Monitored during session;Premedicated before session;Repositioned    Home Living                      Prior Function  PT Goals (current goals can now be found in the care plan section) Progress towards PT goals: Progressing toward goals    Frequency    BID      PT Plan Current plan remains appropriate    Co-evaluation             End of Session Equipment Utilized During Treatment: Right knee immobilizer Activity Tolerance: Patient tolerated treatment well Patient left: in chair;with call bell/phone within reach;with chair alarm set;with family/visitor present     Time: 1610-9604 PT Time Calculation (min) (ACUTE ONLY): 35 min  Charges:  $Therapeutic Exercise: 8-22 mins $Therapeutic Activity: 8-22 mins                     G CodesScot Dock, PTA 08/16/2016, 10:46 AM

## 2016-08-16 NOTE — Progress Notes (Signed)
Chaplain rounding 1A visited the patient to introduce chaplain services. Patient was pleased with the chaplain visit and asked for prayers and spiritual support. Chaplain offered prayers and presence.     08/16/16 1600  Clinical Encounter Type  Visited With Patient  Visit Type Spiritual support  Referral From Nurse  Spiritual Encounters  Spiritual Needs Prayer

## 2016-08-16 NOTE — Progress Notes (Signed)
  SUBJECTIVE: Patient is alert and oriented and conversant. She has discomfort due to her surgery but no chest pain, palpitations, or shortness of breath.   Vitals:   08/16/16 0356 08/16/16 0430 08/16/16 0500 08/16/16 0715  BP: (!) 99/45 (!) 110/54  131/77  Pulse: 77 95  99  Resp: 16 16  16   Temp: 98.2 F (36.8 C) 97.5 F (36.4 C)  98.2 F (36.8 C)  TempSrc: Oral Oral  Oral  SpO2: 97% 96%  95%  Weight:   208 lb 15 oz (94.8 kg)   Height:        Intake/Output Summary (Last 24 hours) at 08/16/16 0858 Last data filed at 08/16/16 0430  Gross per 24 hour  Intake              829 ml  Output                0 ml  Net              829 ml    LABS: Basic Metabolic Panel:  Recent Labs  16/08/9609/04/17 0557  CREATININE 0.74   Liver Function Tests: No results for input(s): AST, ALT, ALKPHOS, BILITOT, PROT, ALBUMIN in the last 72 hours. No results for input(s): LIPASE, AMYLASE in the last 72 hours. CBC:  Recent Labs  08/15/16 1706 08/16/16 0557  WBC 8.2 8.3  HGB 6.3* 9.0*  HCT 18.3* 26.3*  MCV 92.2 88.1  PLT 282 307   Cardiac Enzymes: No results for input(s): CKTOTAL, CKMB, CKMBINDEX, TROPONINI in the last 72 hours. BNP: Invalid input(s): POCBNP D-Dimer: No results for input(s): DDIMER in the last 72 hours. Hemoglobin A1C: No results for input(s): HGBA1C in the last 72 hours. Fasting Lipid Panel: No results for input(s): CHOL, HDL, LDLCALC, TRIG, CHOLHDL, LDLDIRECT in the last 72 hours. Thyroid Function Tests: No results for input(s): TSH, T4TOTAL, T3FREE, THYROIDAB in the last 72 hours.  Invalid input(s): FREET3 Anemia Panel: No results for input(s): VITAMINB12, FOLATE, FERRITIN, TIBC, IRON, RETICCTPCT in the last 72 hours.   PHYSICAL EXAM General: Well developed, well nourished, in no acute distress HEENT:  Normocephalic and atramatic Neck:  No JVD.  Lungs: Clear bilaterally to auscultation and percussion. Heart: HR irregular . Normal S1 and S2 without gallops or  murmurs.  Abdomen: Bowel sounds are positive, abdomen soft and non-tender  Msk:  Back normal, normal gait. Normal strength and tone for age. Extremities: Right lower extremity edema Neuro: Alert and oriented X 3. Psych:  Good affect, responds appropriately  TELEMETRY: Atrial fibrillation in the 90s  ASSESSMENT AND PLAN: Atrial fibrillation in patient status post right femur fracture repair. She has been treated for atrial fibrillation and was maintaining sinus rhythm on sotalol and diltiazem, however, these medications were held immediately postop due to hypotension. The patient develops atrial fibrillation yesterday, blood pressure is stable and these medications have been resumed. [facility] and says that she converted previously on medications and did not require cardioversion. We'll continue medical therapy for atrial fibrillation and continue to monitor. The patient is anticoagulated with Eliquis.  Principal Problem:   Closed disp comminuted fracture of shaft of right femur with malunion Active Problems:   Right supracondylar humerus fracture   PAF (paroxysmal atrial fibrillation) (HCC)   Diabetes mellitus without complication (HCC)   Femur fracture, right (HCC)   Hypovolemia    Angelica BonJanine Rickia [place], NP 08/16/2016 8:58 AM

## 2016-08-17 LAB — TYPE AND SCREEN
ABO/RH(D): A POS
ANTIBODY SCREEN: NEGATIVE
UNIT DIVISION: 0

## 2016-08-17 LAB — GLUCOSE, CAPILLARY
Glucose-Capillary: 135 mg/dL — ABNORMAL HIGH (ref 65–99)
Glucose-Capillary: 239 mg/dL — ABNORMAL HIGH (ref 65–99)

## 2016-08-17 MED ORDER — HYDROCODONE-ACETAMINOPHEN 7.5-325 MG PO TABS: 1.0000 | ORAL_TABLET | ORAL | 0 refills | Status: AC | PRN

## 2016-08-17 MED ORDER — CELECOXIB 200 MG PO CAPS: 200.0000 mg | ORAL_CAPSULE | Freq: Two times a day (BID) | ORAL | 0 refills | Status: AC

## 2016-08-17 NOTE — Progress Notes (Signed)
This was a follow-up visit with the patient but patient was a sleep at the time of this visit. Chaplain talked to daughter and promised to came back later.     08/17/16 1500  Clinical Encounter Type  Visited With Patient  Visit Type Follow-up  Spiritual Encounters  Spiritual Needs Prayer  Stress Factors  Patient Stress Factors None identified

## 2016-08-17 NOTE — Progress Notes (Signed)
Patient discharged with EMS to [facility]. Patient a/o x3. [facility]

## 2016-08-17 NOTE — Progress Notes (Signed)
Physical Therapy Treatment Patient Details Name: Angelica Lewis MRN: [id number] DOB: 1933-01-31 Today's Date: 08/17/2016    History of Present Illness Pt is an 82 yo F who fell around 08/01/16 resulting in R humeral fracture.  Pt was at [facility] and had another fall resulting in a distal R femur fracture and is s/p ORIF.    PT Comments    Pt agreeable to PT; reports feeling a little bit better today. Pain slightly less today. Pt continues to require increased time with bed mobility and cues for technique, but ultimately performs with little physical assist. Pt requires most assist to scoot to edge of bed. Performs exercises well at edge of bed and able to rest Right lower extremity down on 2 pillows, which is improved from previous sessions requiring leg extended at above 75 degrees of hip flexion. Improved ease of slide board transfer today with improved ability to maintain seated position; continues total assist. At the time of this document, pt has discharge order to skilled nursing facility today. May need to see pt to return to bed via slide board transfer for transfer to skilled nursing facility.   Follow Up Recommendations  SNF     Equipment Recommendations       Recommendations for Other Services       Precautions / Restrictions Precautions Precautions: Fall Required Braces or Orthoses: Knee Immobilizer - Right Knee Immobilizer - Right: On at all times Restrictions Weight Bearing Restrictions: Yes RUE Weight Bearing: Non weight bearing RLE Weight Bearing: Non weight bearing    Mobility  Bed Mobility Overal bed mobility: Needs Assistance       Supine to sit: Min assist;HOB elevated     General bed mobility comments: Increased time/effort. Most difficulty scooting hips forward once in sit  Transfers Overall transfer level: Needs assistance   Transfers: Lateral/Scoot Transfers          Lateral/Scoot Transfers: Total assist;+2 physical  assistance General transfer comment: Improved ease today with pt maintaining good upright seated position. Pt also able to rest RLE on 2 pillows versus having therapist hold parallel to floor  Ambulation/Gait             General Gait Details: Unable   Stairs            Wheelchair Mobility    Modified Rankin (Stroke Patients Only)       Balance                                    Cognition Arousal/Alertness: Awake/alert Behavior During Therapy: WFL for tasks assessed/performed Overall Cognitive Status: Within Functional Limits for tasks assessed                      Exercises General Exercises - Lower Extremity Ankle Circles/Pumps: AROM;Both;20 reps;Seated Quad Sets: Strengthening;Right;20 reps;Seated (RLE elevated) Gluteal Sets: Strengthening;Both;20 reps;Seated Long Arc Quad: Strengthening;Left;20 reps;Seated Hip ABduction/ADduction: AAROM;Right;20 reps;Seated Hip Flexion/Marching: AROM;Left;20 reps;Seated Other Exercises Other Exercises: L ham curl resisted 20x seated    General Comments        Pertinent Vitals/Pain Pain Assessment: 0-10 Pain Score: 3  Pain Location: R shoulder/hip/thigh Pain Descriptors / Indicators: Sore;Sharp Pain Intervention(s): Limited activity within patient's tolerance;Monitored during session;Premedicated before session;Repositioned    Home Living                      Prior Function  PT Goals (current goals can now be found in the care plan section) Progress towards PT goals: Progressing toward goals    Frequency    BID      PT Plan Current plan remains appropriate    Co-evaluation             End of Session   Activity Tolerance: Patient tolerated treatment well Patient left: in chair;with call bell/phone within reach;with chair alarm set;with family/visitor present     Time: [id number] PT Time Calculation (min) (ACUTE ONLY): 27 min  Charges:  $Therapeutic  Exercise: 8-22 mins $Therapeutic Activity: 8-22 mins                    G Codes:      Scot DockHeidi E Barnes, PTA 08/17/2016, 1:40 PM

## 2016-08-17 NOTE — Progress Notes (Signed)
Physical Therapy Treatment Patient Details Name: Angelica FlavorsMartha Lewis MRN: [id number] DOB: 06/02/1933 Today's Date: 08/17/2016    History of Present Illness Pt is an 82 yo F who fell around 08/01/16 resulting in R humeral fracture.  Pt was at [facility] and had another fall resulting in a distal R femur fracture and is s/p ORIF.    PT Comments    Pt continues up in chair comfortably, but notes she requires personal hygiene; pt also will be transferring to skilled nursing facility today. Transfer via slide board performed total assist x 2 and returned to supine/repositioned upright in bed with Mod/Max A x 2. Pt to be discharged to rehab to continue progress with strength and all functional mobility.   Follow Up Recommendations  SNF     Equipment Recommendations       Recommendations for Other Services       Precautions / Restrictions Precautions Precautions: Fall Required Braces or Orthoses: Knee Immobilizer - Right Knee Immobilizer - Right: On at all times Restrictions Weight Bearing Restrictions: Yes RUE Weight Bearing: Non weight bearing RLE Weight Bearing: Non weight bearing    Mobility  Bed Mobility Overal bed mobility: Needs Assistance Bed Mobility: Sit to Supine     Supine to sit: Min assist;HOB elevated Sit to supine: Mod assist;+2 for physical assistance   General bed mobility comments: Increased time/effort. Most difficulty scooting hips forward once in sit  Transfers Overall transfer level: Needs assistance   Transfers: Lateral/Scoot Transfers          Lateral/Scoot Transfers: Total assist;+2 physical assistance General transfer comment: Improved ease today with pt maintaining good upright seated position. Pt also able to rest RLE on 2 pillows versus having therapist hold parallel to floor  Ambulation/Gait             General Gait Details: Unable   Stairs            Wheelchair Mobility    Modified Rankin (Stroke Patients Only)        Balance                                    Cognition Arousal/Alertness: Awake/alert Behavior During Therapy: WFL for tasks assessed/performed Overall Cognitive Status: Within Functional Limits for tasks assessed                      Exercises General Exercises - Lower Extremity Ankle Circles/Pumps: AROM;Both;20 reps;Seated Quad Sets: Strengthening;Right;20 reps;Seated (RLE elevated) Gluteal Sets: Strengthening;Both;20 reps;Seated Long Arc Quad: Strengthening;Left;20 reps;Seated Hip ABduction/ADduction: AAROM;Right;20 reps;Seated Hip Flexion/Marching: AROM;Left;20 reps;Seated Other Exercises Other Exercises: L ham curl resisted 20x seated    General Comments        Pertinent Vitals/Pain Pain Assessment: 0-10 Pain Score: 3  Pain Location: R shoulder/hip/thigh Pain Descriptors / Indicators: Sore;Sharp Pain Intervention(s): Limited activity within patient's tolerance;Monitored during session;Premedicated before session;Repositioned    Home Living Family/patient expects to be discharged to:: Skilled nursing facility Living Arrangements: Alone                  Prior Function            PT Goals (current goals can now be found in the care plan section) Progress towards PT goals: Progressing toward goals    Frequency    BID      PT Plan Current plan remains appropriate    Co-evaluation  End of Session   Activity Tolerance: Patient tolerated treatment well Patient left: with call bell/phone within reach;with family/visitor present;in bed;Other (comment) (pt called nsg assist for personal hygiene)     Time: 1610-9604 PT Time Calculation (min) (ACUTE ONLY): 14 min  Charges:  $Therapeutic Exercise: 8-22 mins $Therapeutic Activity: 8-22 mins                    G CodesScot Dock, PTA 08/17/2016, 2:37 PM

## 2016-08-17 NOTE — [place] FL2 (Signed)
[place] MEDICAID FL2 LEVEL OF CARE SCREENING TOOL     IDENTIFICATION  Patient Name: Angelica Lewis Birthdate: 01/03/1933 Sex: female Admission Date (Current Location): 08/12/2016  [place] and [place] Number:  Chiropodist and Address:  [facility], [address], [place], [place] [address]      Provider Number: [id number]  Attending Physician Name and Address:  Katha Hamming, MD  Relative Name and Phone Number:       Current Level of Care: Hospital Recommended Level of Care: Skilled Nursing Facility Prior Approval Number:    Date Approved/Denied:   PASRR Number:  [id number]  Discharge Plan: SNF    Current Diagnoses: Patient Active Problem List   Diagnosis Date Noted  . Hypovolemia 08/13/2016  . Closed disp comminuted fracture of shaft of right femur with malunion 08/12/2016  . Right supracondylar humerus fracture 08/12/2016  . PAF (paroxysmal atrial fibrillation) (HCC) 08/12/2016  . Diabetes mellitus without complication (HCC) 08/12/2016  . Femur fracture, right (HCC) 08/12/2016    Orientation RESPIRATION BLADDER Height & Weight     Self, Time, Situation, Place  Normal Incontinent Weight: 208 lb 15 oz (94.8 kg) Height:  5\' 4"  (162.6 cm)  BEHAVIORAL SYMPTOMS/MOOD NEUROLOGICAL BOWEL NUTRITION STATUS      Continent Diet (Diabetic)  AMBULATORY STATUS COMMUNICATION OF NEEDS Skin   Extensive Assist Verbally Normal                       Personal Care Assistance Level of Assistance  Bathing, Feeding, Dressing, Total care Bathing Assistance: Maximum assistance Feeding assistance: Limited assistance Dressing Assistance: Maximum assistance Total Care Assistance: Maximum assistance   Functional Limitations Info  Sight, Hearing, Speech Sight Info: Adequate Hearing Info: Impaired (Hard of hearing) Speech Info: Adequate    SPECIAL CARE FACTORS FREQUENCY  PT (By licensed PT), OT (By licensed OT), Speech therapy     PT  Frequency: x5 OT Frequency: x5     Speech Therapy Frequency: x3      Contractures Contractures Info: Not present    Additional Factors Info  Code Status, Allergies Code Status Info: Full code Allergies Info: Fentanyl, Morphine, Ace Inhibitors, Ciprofloxacin, Codeine Sulfate, Doxycycline, Levofloxacin, Morphine Sulfate, Oxybenzone, Oxycodone, Oxycontin Oxycodone Hcl, Penicillins, Tape           Current Medications (08/17/2016):  This is the current hospital active medication list Current Facility-Administered Medications  Medication Dose Route Frequency Provider Last Rate Last Dose  . 0.9 %  sodium chloride infusion   Intravenous Once Srikar Sudini, MD      . acetaminophen (TYLENOL) tablet 650 mg  650 mg Oral Q6H PRN Deeann Saint, MD   650 mg at 08/15/16 1455   Or  . acetaminophen (TYLENOL) suppository 650 mg  650 mg Rectal Q6H PRN Deeann Saint, MD      . apixaban Everlene Balls) tablet 2.5 mg  2.5 mg Oral BID Deeann Saint, MD   2.5 mg at 08/17/16 0836  . bisacodyl (DULCOLAX) suppository 10 mg  10 mg Rectal Daily PRN Deeann Saint, MD      . celecoxib (CELEBREX) capsule 200 mg  200 mg Oral Q12H Deeann Saint, MD   200 mg at 08/17/16 0836  . diltiazem (CARDIZEM) tablet 30 mg  30 mg Oral Q6H Berton Bon, NP   30 mg at 08/17/16 1204  . ferrous sulfate tablet 325 mg  325 mg Oral Q breakfast Katha Hamming, MD   325 mg at 08/17/16 0836  .  gabapentin (NEURONTIN) capsule 600 mg  600 mg Oral QHS Katha HammingSnehalatha Konidena, MD   600 mg at 08/16/16 2336  . HYDROcodone-acetaminophen (NORCO) 7.5-325 MG per tablet 1-2 tablet  1-2 tablet Oral Q4H PRN Deeann SaintHoward Miller, MD   1 tablet at 08/17/16 0835  . HYDROmorphone (DILAUDID) injection 1 mg  1 mg Intravenous Q2H PRN Deeann SaintHoward Miller, MD   1 mg at 08/14/16 2212  . insulin pump   Subcutaneous TID AC, HS, 0200 Katha HammingSnehalatha Konidena, MD      . magnesium hydroxide (MILK OF MAGNESIA) suspension 30 mL  30 mL Oral Daily PRN Deeann SaintHoward Miller, MD      . methocarbamol  (ROBAXIN) tablet 500 mg  500 mg Oral Q6H PRN Deeann SaintHoward Miller, MD   500 mg at 08/15/16 2322   Or  . methocarbamol (ROBAXIN) 500 mg in dextrose 5 % 50 mL IVPB  500 mg Intravenous Q6H PRN Deeann SaintHoward Miller, MD      . metoCLOPramide (REGLAN) tablet 5-10 mg  5-10 mg Oral Q8H PRN Deeann SaintHoward Miller, MD       Or  . metoCLOPramide (REGLAN) injection 5-10 mg  5-10 mg Intravenous Q8H PRN Deeann SaintHoward Miller, MD   10 mg at 08/16/16 1908  . pravastatin (PRAVACHOL) tablet 20 mg  20 mg Oral q1800 Katha HammingSnehalatha Konidena, MD   20 mg at 08/16/16 1744  . senna-docusate (Senokot-S) tablet 1 tablet  1 tablet Oral BID Katha HammingSnehalatha Konidena, MD   1 tablet at 08/17/16 0835  . sertraline (ZOLOFT) tablet 100 mg  100 mg Oral Daily Katha HammingSnehalatha Konidena, MD   100 mg at 08/17/16 0836  . sotalol (BETAPACE) tablet 160 mg  160 mg Oral Q12H Laurier NancyShaukat A Khan, MD   160 mg at 08/17/16 [id number]     Discharge Medications: Please see discharge summary for a list of discharge medications.  Relevant Imaging Results:  Relevant Lab Results:   Additional Information SSN # [id number]  Arnav Cregg, Darleen CrockerBailey M, LCSW

## 2016-08-17 NOTE — Progress Notes (Signed)
Subjective: 4 Days Post-Op Procedure(s) (LRB): OPEN REDUCTION INTERNAL FIXATION (ORIF) DISTAL FEMUR FRACTURE (Right)    Patient reports pain as mild.  Objective:   VITALS:   Vitals:   08/17/16 0721 08/17/16 1116  BP: (!) 146/69 123/70  Pulse: 67 96  Resp: 17   Temp: 98 F (36.7 C) 97.4 F (36.3 C)    Neurologically intact ABD soft Neurovascular intact Sensation intact distally Intact pulses distally Dorsiflexion/Plantar flexion intact Incision: no drainage  LABS  Recent Labs  08/15/16 1706 08/16/16 0557  HGB 6.3* 9.0*  HCT 18.3* 26.3*  WBC 8.2 8.3  PLT 282 307     Recent Labs  08/16/16 0557  CREATININE 0.74    No results for input(s): LABPT, INR in the last 72 hours.   Assessment/Plan: 4 Days Post-Op Procedure(s) (LRB): OPEN REDUCTION INTERNAL FIXATION (ORIF) DISTAL FEMUR FRACTURE (Right)   Up with therapy Discharge to SNF

## 2016-08-17 NOTE — Progress Notes (Signed)
  SUBJECTIVE: Pt is feeling well this morning without any chest pain or shortness of breath. She is eating breakfast.   Vitals:   08/16/16 2016 08/16/16 2345 08/17/16 0402 08/17/16 0721  BP: 139/69 (!) 143/84 118/84 (!) 146/69  Pulse: 97 96 100 67  Resp: 19 19 19 17   Temp: 98.7 F (37.1 C) 98.8 F (37.1 C) 97.3 F (36.3 C) 98 F (36.7 C)  TempSrc: Oral Oral Oral Oral  SpO2: 96% 96% 95% 93%  Weight:      Height:        Intake/Output Summary (Last 24 hours) at 08/17/16 0844 Last data filed at 08/17/16 0756  Gross per 24 hour  Intake              480 ml  Output                0 ml  Net              480 ml    LABS: Basic Metabolic Panel:  Recent Labs  16/08/9609/04/17 0557  CREATININE 0.74   Liver Function Tests: No results for input(s): AST, ALT, ALKPHOS, BILITOT, PROT, ALBUMIN in the last 72 hours. No results for input(s): LIPASE, AMYLASE in the last 72 hours. CBC:  Recent Labs  08/15/16 1706 08/16/16 0557  WBC 8.2 8.3  HGB 6.3* 9.0*  HCT 18.3* 26.3*  MCV 92.2 88.1  PLT 282 307   Cardiac Enzymes: No results for input(s): CKTOTAL, CKMB, CKMBINDEX, TROPONINI in the last 72 hours. BNP: Invalid input(s): POCBNP D-Dimer: No results for input(s): DDIMER in the last 72 hours. Hemoglobin A1C: No results for input(s): HGBA1C in the last 72 hours. Fasting Lipid Panel: No results for input(s): CHOL, HDL, LDLCALC, TRIG, CHOLHDL, LDLDIRECT in the last 72 hours. Thyroid Function Tests: No results for input(s): TSH, T4TOTAL, T3FREE, THYROIDAB in the last 72 hours.  Invalid input(s): FREET3 Anemia Panel: No results for input(s): VITAMINB12, FOLATE, FERRITIN, TIBC, IRON, RETICCTPCT in the last 72 hours.   PHYSICAL EXAM General: Well developed, well nourished, in no acute distress HEENT:  Normocephalic and atramatic Neck:  No JVD.  Lungs: Clear bilaterally to auscultation and percussion. Heart: HRRR . Normal S1 and S2 without gallops or murmurs.  Abdomen: Bowel sounds  are positive, abdomen soft and non-tender  Msk:  Back normal, normal gait. Normal strength and tone for age. Extremities: No clubbing, cyanosis or edema.   Neuro: Alert and oriented X 3. Psych:  Good affect, responds appropriately  TELEMETRY: Atrial fibrillation in the 90s   ASSESSMENT AND PLAN: Patient is status post hip fracture repair and doing well. She has a history of atrial fibrillation and her medications were held due to hypotension. Immediately postoperative and she is now in atrial fibrillation. Her medications have been resumed and her rate is controlled and we can follow-up on this in the office.  Principal Problem:   Closed disp comminuted fracture of shaft of right femur with malunion Active Problems:   Right supracondylar humerus fracture   PAF (paroxysmal atrial fibrillation) (HCC)   Diabetes mellitus without complication (HCC)   Femur fracture, right (HCC)   Hypovolemia    Berton BonJanine Emyah Roznowski, NP 08/17/2016 8:44 AM

## 2016-08-17 NOTE — Progress Notes (Signed)
Patient is medically stable for D/C  back to [facility] today. Per [facility] patient will return to room 502. RN will call report to RN Elly ModenaKathy Rogers at  [phone]. Social work Tax inspectorintern sent D/C orders to [employer] via [employer]. Patient is aware of above. Patient's Rosalita ChessmanSuzanne is at bedside and is aware of discharge today. Please reconsult if future CSW needs arise. Social work Tax inspectorintern is signing off.   Arvell Pulsifer [phone]

## 2016-08-17 NOTE — Discharge Summary (Signed)
Angelica Lewis, is a 82 y.o. female  DOB 01/13/1933  MRN [id number].  Admission date:  08/12/2016  Admitting Physician  Deeann Saint, MD  Discharge Date:  08/17/2016   Primary MD  Keane Police, MD  Recommendations for primary care physician for things to follow:   Follow-up with orthopedic physician Dr. Deeann Saint 3 weeks. Follow-up with primary doctor in 2 weeks. Follow-up with primary cardiologist in 1 week.  Admission Diagnosis  Fall, initial encounter [W19.XXXA] Femur fracture, right, closed, initial encounter [S72.91XA]   Discharge Diagnosis  Fall, initial encounter [W19.XXXA] Femur fracture, right, closed, initial encounter [S72.91XA]   Principal Problem:   Closed disp comminuted fracture of shaft of right femur with malunion Active Problems:   Right supracondylar humerus fracture   PAF (paroxysmal atrial fibrillation) (HCC)   Diabetes mellitus without complication (HCC)   Femur fracture, right (HCC)   Hypovolemia      Past Medical History:  Diagnosis Date  . Age-related osteoporosis with current pathological fracture of right humerus   . Chronic pain syndrome   . Diabetes mellitus without complication (HCC)   . GERD (gastroesophageal reflux disease)   . Hypertension   . Other iron deficiency anemias   . Paroxysmal a-fib [facility])     Past Surgical History:  Procedure Laterality Date  . ORIF FEMUR FRACTURE Right 08/13/2016   Procedure: OPEN REDUCTION INTERNAL FIXATION (ORIF) DISTAL FEMUR FRACTURE;  Surgeon: Deeann Saint, MD;  Location: [facility] ORS;  Service: Orthopedics;  Laterality: Right;       History of present illness and  Hospital Course:     Kindly see H&P for history of present illness and admission details, please review complete Labs, Consult reports and Test reports for all details in  brief  HPI  from the history and physical done on the day of admission 82 year old female patient with history of diabetes mellitus type 2 on insulin pump, essential hypertension, paroxysmal atrial fibrillation, recent right humerus fracture came from [facility] because of the fall, found to have a right femur fracture. Patient admitted to medical medical service.   Hospital Course  #1 right femur fracture; status post open reduction, internal fixation by orthopedic on October 2. Postoperatively patient seen by physical therapy, started on incentive spirometer, restarted Eliquis. #2/acute blood loss anemia secondary to surgery: Received 2 units of packed RBC, myoglobin was 6.3 on October 3 improved to 9 after 2 units of packed RBC. Patient feels better. #3 hypotension;after the surgery secondary to anesthesia: Improved with aggressive fluid resuscitation. #4 chronic atrial fibrillation: Seen by cardiology because of tachycardia up to 1 40 bpm at one time on telemetry. Restarted the sotalol, Cardizem. Patient can continue both of them in addition to full dose anticoagulation with Eliquis. Patient can see her cardiologist as an outpatient. #Diabetes mellitus type 2:continue Insulin pump   Discharge Condition:stable Follow UP  Contact information for after-discharge care    Destination    [facility] .   Specialty:  Skilled Nursing Facility Contact information: [address] [place] [address] [phone]                Discharge Instructions  and  Discharge Medications        Medication List    STOP taking these medications   nitrofurantoin (macrocrystal-monohydrate) 100 MG capsule Commonly known as:  MACROBID     TAKE these medications   acetaminophen 325 MG tablet Commonly known as:  TYLENOL Take 325 mg by  mouth every 6 (six) hours as needed for moderate pain. *Chronic pain*   aspirin 81 MG chewable tablet Chew 81 mg by mouth  daily.   celecoxib 200 MG capsule Commonly known as:  CELEBREX Take 1 capsule (200 mg total) by mouth every 12 (twelve) hours.   co-enzyme Q-10 30 MG capsule Take 30 mg by mouth daily.   Cyanocobalamin 1000 MCG Tbcr Take 1,000 mcg by mouth daily.   diltiazem 120 MG 24 hr capsule Commonly known as:  CARDIZEM CD Take 120 mg by mouth daily.   ELIQUIS 2.5 MG Tabs tablet Generic drug:  apixaban Take 2.5 mg by mouth 2 (two) times daily. *Hold this medication for 48 hours starting today 08/12/16 per MD*   ferrous sulfate 325 (65 FE) MG tablet Take 325 mg by mouth daily.   gabapentin 300 MG capsule Commonly known as:  NEURONTIN Take 600 mg by mouth at bedtime.   HYDROcodone-acetaminophen 7.5-325 MG tablet Commonly known as:  NORCO Take 1-2 tablets by mouth every 4 (four) hours as needed (breakthrough pain).   hydrocortisone cream 1 % Apply 1 application topically 2 (two) times daily.   insulin lispro 100 UNIT/ML injection Commonly known as:  HUMALOG Inject into the skin See admin instructions. Use as directed before meals and at bedtime per sliding scale. BS: less than 60= Call MD              151-200= 2 units,              201-250= 4 units,              251-300= 6 units,              301-350= 8 units,              351-400= 10 units, Greater than 400= 12 units and Call MD.   lidocaine 5 % Commonly known as:  LIDODERM Place 1 patch onto the skin daily. Remove & Discard patch within 12 hours or as directed by MD   losartan 25 MG tablet Commonly known as:  COZAAR Take 25 mg by mouth daily.   lovastatin 20 MG tablet Commonly known as:  MEVACOR Take 20 mg by mouth daily.   multivitamin tablet Take 1 tablet by mouth daily.   omeprazole 20 MG capsule Commonly known as:  PRILOSEC Take 20 mg by mouth daily.   POLYETHYLENE GLYCOL 3350 PO Take 17 g by mouth daily as needed. For constipation. *Mix in 4-8 oz. of fluid*   promethazine 12.5 MG tablet Commonly known as:   PHENERGAN Take 12.5 mg by mouth every 6 (six) hours as needed for nausea.   REFRESH PLUS 0.5 % Soln Generic drug:  carboxymethylcellulose Place 1 drop into both eyes 4 (four) times daily as needed.   senna 8.6 MG Tabs tablet Commonly known as:  SENOKOT Take 17.2 mg by mouth at bedtime as needed for mild constipation or moderate constipation.   sertraline 100 MG tablet Commonly known as:  ZOLOFT Take 100 mg by mouth daily.   sotalol 160 MG tablet Commonly known as:  BETAPACE Take 160 mg by mouth 2 (two) times daily.   TRULICITY 1.5 MG/0.5ML Sopn Generic drug:  Dulaglutide Inject 1.5 mg into the skin once a week.   vitamin C 500 MG tablet Commonly known as:  ASCORBIC ACID Take 500 mg by mouth daily.   Vitamin D-3 1000 units Caps Take 1,000 Units by mouth daily.  Diet and Activity recommendation: See Discharge Instructions above   Consults obtained - orthopedic,Cardio,PT  Major procedures and Radiology Reports - PLEASE review detailed and final reports for all details, in brief -     Dg Chest 1 View  Result Date: 08/12/2016 CLINICAL DATA:  Pt presents to ED via EMS from [facility] c/o fall and right knee pain. Pt states she slipped on the floor in the bathroom and landed on right knee. Pt has hx R humerus fracture x 1 week, pt supposed to f/u with surgeon 08/16/16 at [facility]. All previous xrays obtained at [facility]. EXAM: CHEST 1 VIEW COMPARISON:  None. FINDINGS: Cardiac silhouette is normal in size. No mediastinal or hilar masses or evidence of adenopathy. Clear lungs.  No pleural effusion or pneumothorax. Proximal right humeral fracture is incompletely imaged. Bones are demineralized. No other fractures. IMPRESSION: No acute cardiopulmonary disease. Electronically Signed   By: Amie Portland M.D.   On: 08/12/2016 09:40   Ct Head Wo Contrast  Result Date: 08/12/2016 CLINICAL DATA:  Pt presents to ED via EMS from [facility] c/o fall and R knee pain. Pt states she  slipped on the floor in the bathroom and landed on R knee. Pt denies LOC but states she did hit her head. EXAM: CT HEAD WITHOUT CONTRAST TECHNIQUE: Contiguous axial images were obtained from the base of the skull through the vertex without intravenous contrast. COMPARISON:  None. FINDINGS: Brain: The ventricles are normal in configuration. There is ventricular and sulcal enlargement reflecting mild to moderate diffuse atrophy. No hydrocephalus. There are no parenchymal masses or mass effect. There is no evidence of a cortical infarct. Patchy white matter hypoattenuation is noted consistent with moderate chronic microvascular ischemic change. There are no extra-axial masses or abnormal fluid collections. There is no intracranial hemorrhage. Vascular: No hyperdense vessel or unexpected calcification. Skull: Normal. Negative for fracture or focal lesion. Sinuses/Orbits: No acute finding. Other: None. IMPRESSION: 1. No acute intracranial abnormalities. No skull fracture or intracranial hemorrhage. 2. Atrophy and chronic microvascular ischemic change. Electronically Signed   By: Amie Portland M.D.   On: 08/12/2016 09:06   Dg Humerus Right  Result Date: 08/12/2016 CLINICAL DATA:  Pt presents to ED via EMS from [facility] c/o fall and right knee pain. Pt states she slipped on the floor in the bathroom and landed on right knee. Pt has hx R humerus fracture x 1 week, pt supposed to f/u with surgeon 08/16/16 at [facility]. All previous xrays obtained at [facility]. EXAM: RIGHT HUMERUS - 2+ VIEW COMPARISON:  None. FINDINGS: There is a comminuted fracture of the right humerus. An oblique to spiral fracture component extends from the midshaft to the proximal shaft with a more comminuted fracture of the metaphysis. There is less than 1 cm fracture displacement. There is mild apex anteromedial angulation. Bones are demineralized. Shoulder and elbow joints are normally aligned. IMPRESSION: Comminuted fracture of the proximal right humerus  as described. No dislocation. Electronically Signed   By: Amie Portland M.D.   On: 08/12/2016 09:39   Dg C-arm 61-120 Min  Result Date: 08/13/2016 CLINICAL DATA:  Right distal femur ORIF. EXAM: DG C-ARM 61-120 MIN; RIGHT FEMUR 1 VIEW FLUOROSCOPY TIME:  51 seconds COMPARISON:  Right femur radiographs - 08/12/2016 FINDINGS: 4 spot intraoperative fluoroscopic images of the right knee are provided for review. Images demonstrate the sequela of sideplate fixation of known comminuted distal femoral metaphysis seal fracture. The sideplate is transfixed with at least 7 cancellous screws. There  is minimal residual displacement of the fracture fragments (seen best in the provided lateral radiograph) though overall there is marked improvement in alignment. Expected adjacent subcutaneous emphysema. An apparent surgical drain / sponge is seen overlying the operative site on the provided more cranial anterior projection image though this is not in seen on the provided lateral radiograph and thus presumably external to the patient. Stable sequela of prior right total knee replacement, incompletely evaluated. IMPRESSION: Post sideplate fixation of comminuted distal femoral metaphysis of fracture. Electronically Signed   By: Simonne Come M.D.   On: 08/13/2016 12:53   Dg Femur 1v Right  Result Date: 08/13/2016 CLINICAL DATA:  Right distal femur ORIF. EXAM: DG C-ARM 61-120 MIN; RIGHT FEMUR 1 VIEW FLUOROSCOPY TIME:  51 seconds COMPARISON:  Right femur radiographs - 08/12/2016 FINDINGS: 4 spot intraoperative fluoroscopic images of the right knee are provided for review. Images demonstrate the sequela of sideplate fixation of known comminuted distal femoral metaphysis seal fracture. The sideplate is transfixed with at least 7 cancellous screws. There is minimal residual displacement of the fracture fragments (seen best in the provided lateral radiograph) though overall there is marked improvement in alignment. Expected adjacent  subcutaneous emphysema. An apparent surgical drain / sponge is seen overlying the operative site on the provided more cranial anterior projection image though this is not in seen on the provided lateral radiograph and thus presumably external to the patient. Stable sequela of prior right total knee replacement, incompletely evaluated. IMPRESSION: Post sideplate fixation of comminuted distal femoral metaphysis of fracture. Electronically Signed   By: Simonne Come M.D.   On: 08/13/2016 12:53   Dg Femur Min 2 Views Right  Result Date: 08/12/2016 CLINICAL DATA:  Pt presents to ED via EMS from [facility] c/o fall and right knee pain. Pt states she slipped on the floor in the bathroom and landed on right knee. Pt has hx R humerus fracture x 1 week, pt supposed to f/u with surgeon 08/16/16 at [facility]. All previous x[facility] obtained at [facility]. EXAM: RIGHT FEMUR 2 VIEWS COMPARISON:  None. FINDINGS: There is a comminuted fracture of the distal femur, extending from the posterior distal diaphysis across metaphysis. The primary distal fracture component is displaced posteriorly by approximately 2.4 cm. There is also angulation between the proximal and distal components with the distal component angulated posteriorly by 45 degrees. The knee prosthetic components are well-seated and normally aligned. Hip joint is normally spaced and aligned. Bones are demineralized. IMPRESSION: Comminuted, displaced and angulated fracture of the distal right femur above the right knee femoral prosthetic component. No dislocation. Electronically Signed   By: Amie Portland M.D.   On: 08/12/2016 09:42   Dg Femur Port, Min 2 Views Right  Result Date: 08/13/2016 CLINICAL DATA:  Postop ORIF of distal right femur fracture. EXAM: RIGHT FEMUR PORTABLE 1 VIEW COMPARISON:  Radiographs 08/12/2016. Intraoperative radiographs earlier today. FINDINGS: Previous right total knee arthroplasty without hardware loosening. Interval fixation of the comminuted distal  femoral fracture with a lateral plate and screws. There is near anatomic reduction of the main fracture fragments. There is a small residual posterior butterfly fragment on the lateral view. Surgical drain and skin staples are in place. The proximal tibia and fibula appear intact. IMPRESSION: Near anatomic reduction of comminuted distal femur fracture post ORIF. No demonstrated complication. Electronically Signed   By: Carey Bullocks M.D.   On: 08/13/2016 14:23    Micro Results    Recent Results (from the past 240 hour(s))  MRSA PCR Screening     Status: None   Collection Time: 08/12/16  8:02 PM  Result Value Ref Range Status   MRSA by PCR NEGATIVE NEGATIVE Final    Comment:        The GeneXpert MRSA Assay (FDA approved for NASAL specimens only), is one component of a comprehensive MRSA colonization surveillance program. It is not intended to diagnose MRSA infection nor to guide or monitor treatment for MRSA infections.        Today   Subjective:   Angelica Lewis today has no headache,no chest abdominal pain,no new weakness tingling or numbness, stable to go to SNF> Objective:   Blood pressure 123/70, pulse 96, temperature 97.4 F (36.3 C), temperature source Oral, resp. rate 17, height 5\' 4"  (1.626 m), weight 94.8 kg (208 lb 15 oz), SpO2 93 %.   Intake/Output Summary (Last 24 hours) at 08/17/16 1215 Last data filed at 08/17/16 1102  Gross per 24 hour  Intake              480 ml  Output                0 ml  Net              480 ml    Exam Awake Alert, Oriented x 3, No new F.N deficits, Normal affect Garrett.AT,PERRAL Supple Neck,No JVD, No cervical lymphadenopathy appriciated.  Symmetrical Chest wall movement, Good air movement bilaterally, CTAB RRR,No Gallops,Rubs or new Murmurs, No Parasternal Heave +ve B.Sounds, Abd Soft, Non tender, No organomegaly appriciated, No rebound -guarding or rigidity. No Cyanosis, Clubbing or edema, No new Rash or bruise  Data Review    CBC w Diff: Lab Results  Component Value Date   WBC 8.3 08/16/2016   HGB 9.0 (L) 08/16/2016   HCT 26.3 (L) 08/16/2016   PLT 307 08/16/2016   LYMPHOPCT 15 08/12/2016   MONOPCT 7 08/12/2016   EOSPCT 5 08/12/2016   BASOPCT 1 08/12/2016    CMP: Lab Results  Component Value Date   NA 136 08/13/2016   K 3.6 08/13/2016   CL 102 08/13/2016   CO2 26 08/13/2016   BUN 20 08/13/2016   CREATININE 0.74 08/16/2016   PROT 6.7 08/13/2016   ALBUMIN 3.1 (L) 08/13/2016   BILITOT 0.5 08/13/2016   ALKPHOS 89 08/13/2016   AST 22 08/13/2016   ALT 15 08/13/2016  .   Total Time in preparing paper work, data evaluation and todays exam - 35 minutes  Angelica Lewis M.D on 08/17/2016 at 12:15 PM    Note: This dictation was prepared with Dragon dictation along with smaller phrase technology. Any transcriptional errors that result from this process are unintentional.

## 2018-05-01 IMAGING — CT CT HEAD W/O CM
3 series · 15 of 43 positions shown, 18 images · non-contrast
Comparison: None.

CLINICAL DATA: Pt presents to ED via EMS from [facility] c/o
fall and R knee pain. Pt states she slipped on the floor in the
bathroom and landed on R knee. Pt denies LOC but states she did hit
her head.

EXAM:
CT HEAD WITHOUT CONTRAST
TECHNIQUE: Contiguous axial images were obtained from the base of the skull
through the vertex without intravenous contrast.

[Series 2: head wo · axial · 0.41mm/px · z∈[-90,+15]mm · 9 of 26 slices shown, 12 images]
[im 3/26  brain]
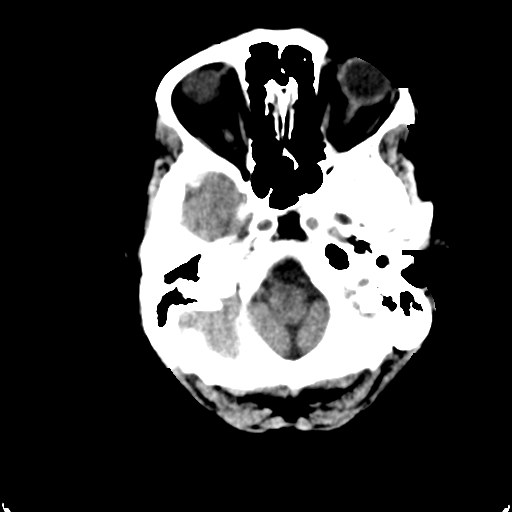
[im 3/26  bone]
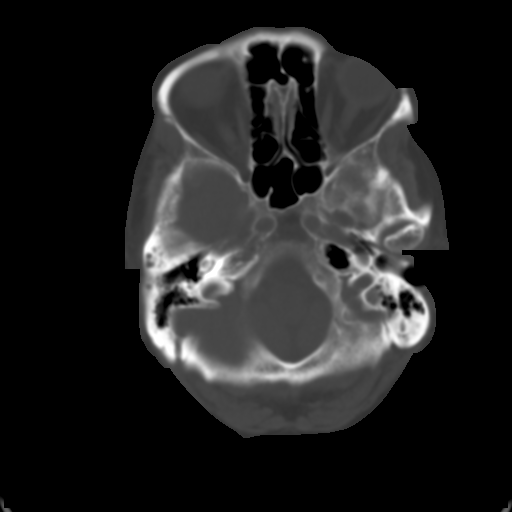
[im 6/26  brain]
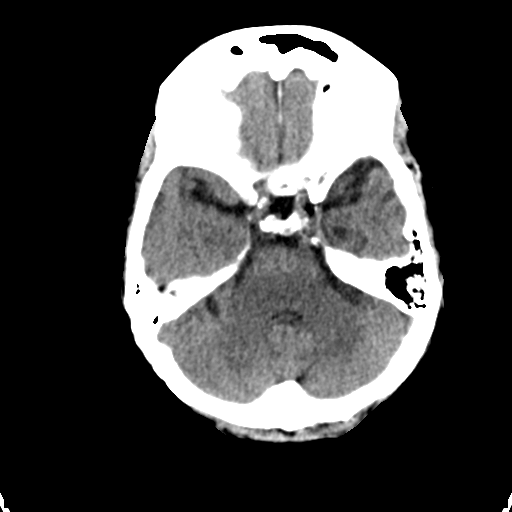
[im 8/26  brain]
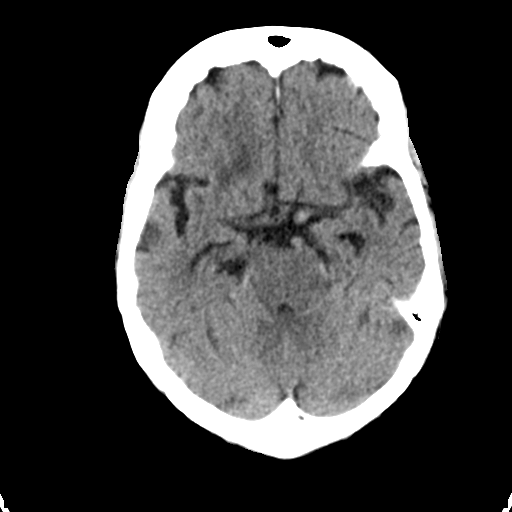
[im 11/26  brain]
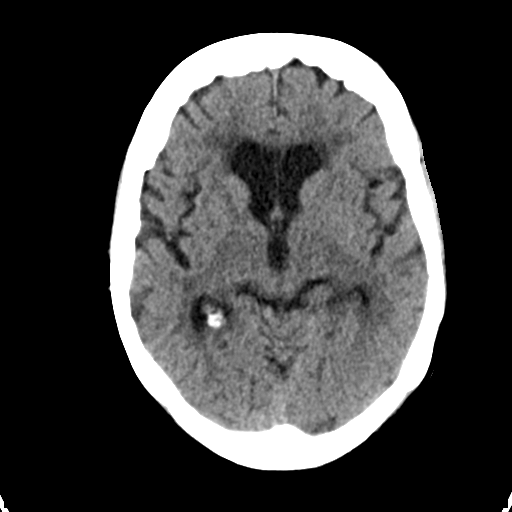
[im 14/26  brain]
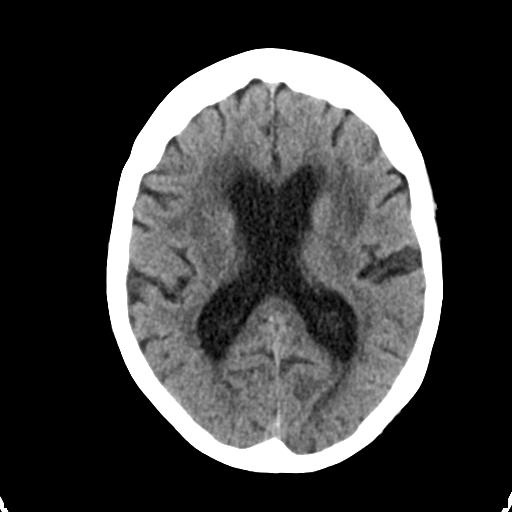
[im 14/26  bone]
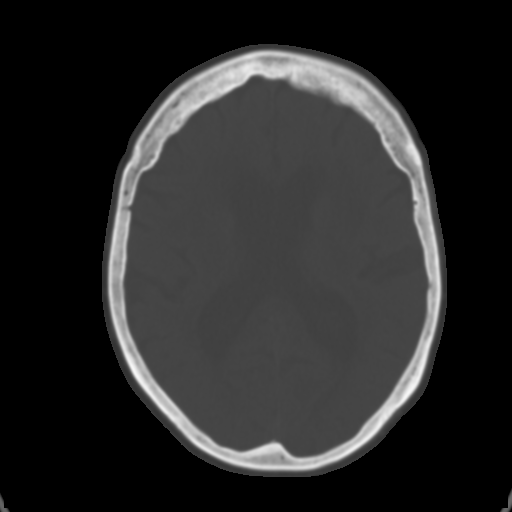
[im 16/26  brain]
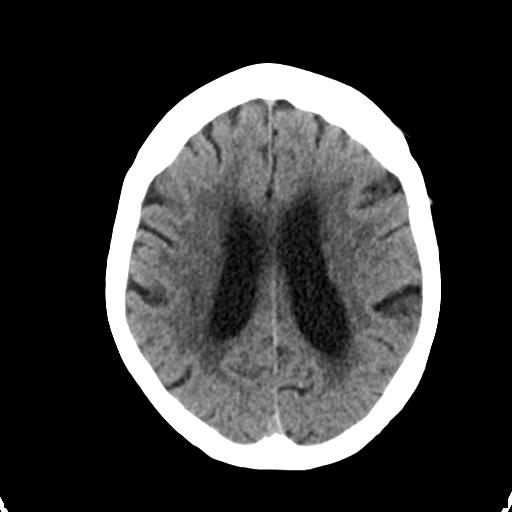
[im 19/26  brain]
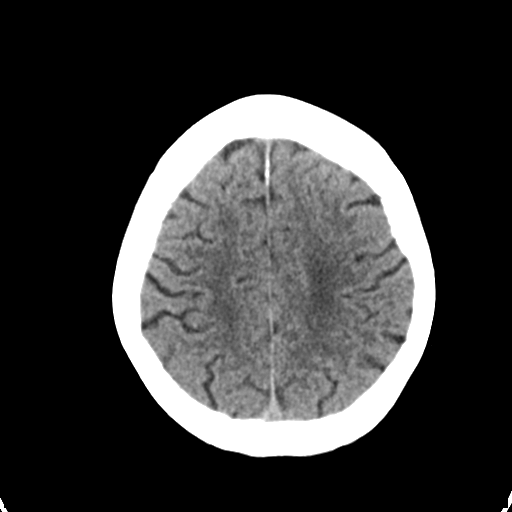
[im 22/26  brain]
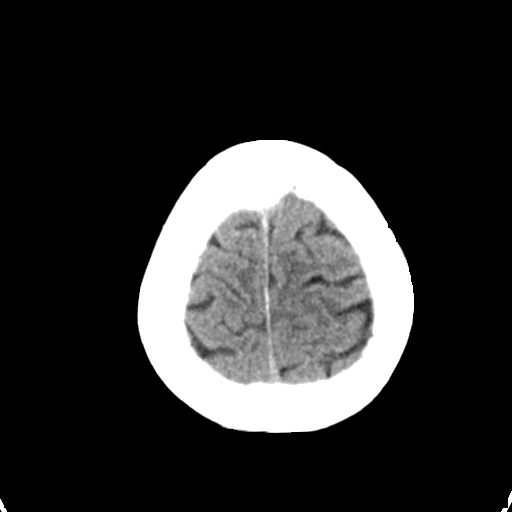
[im 24/26  brain]
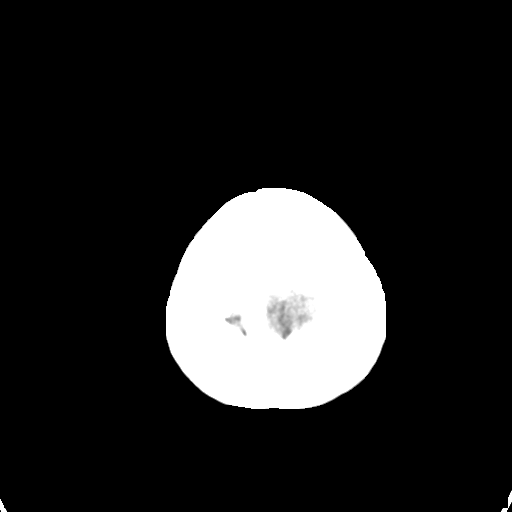
[im 24/26  bone]
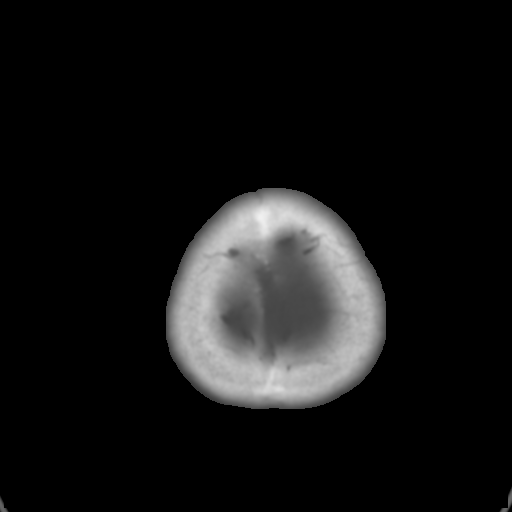

[Series 4: coronal soft tissue · coronal · 0.26mm/px · 3 of 63 slices shown]
[im 21/63  brain]
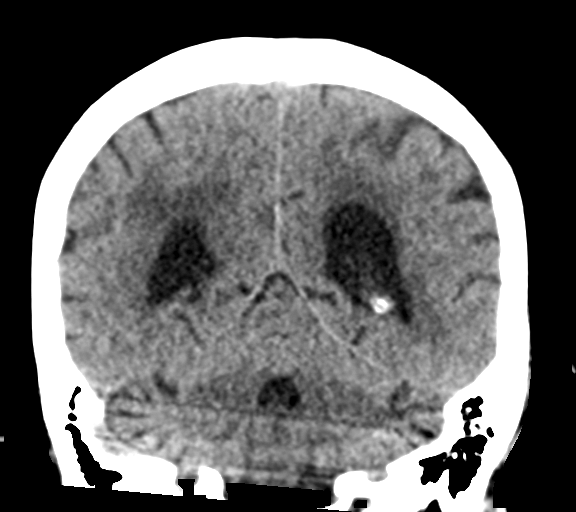
[im 28/63  brain]
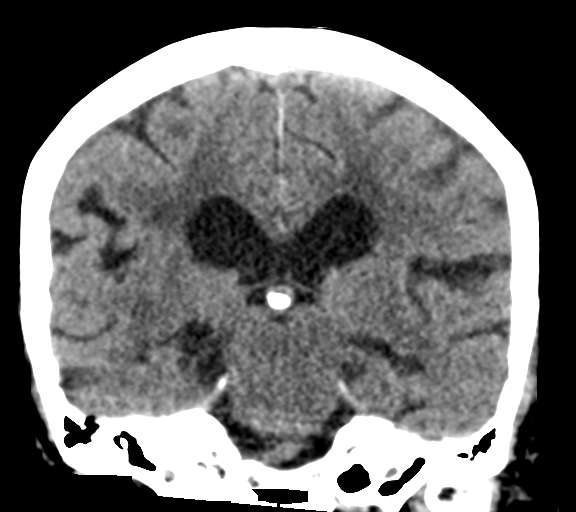
[im 35/63  brain]
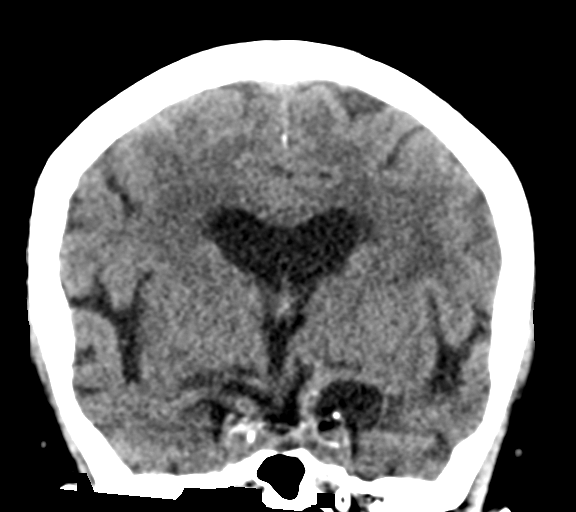

[Series 5: sagittal soft tissue · sagittal · 0.26mm/px · 3 of 50 slices shown]
[im 17/50  brain]
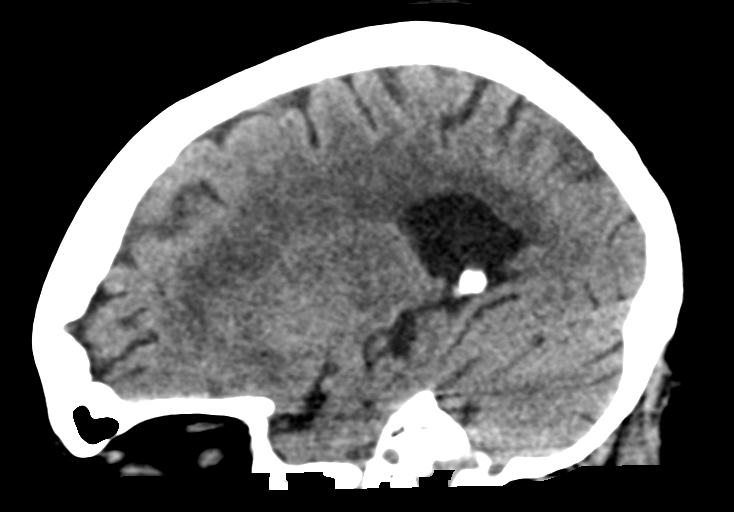
[im 25/50  brain]
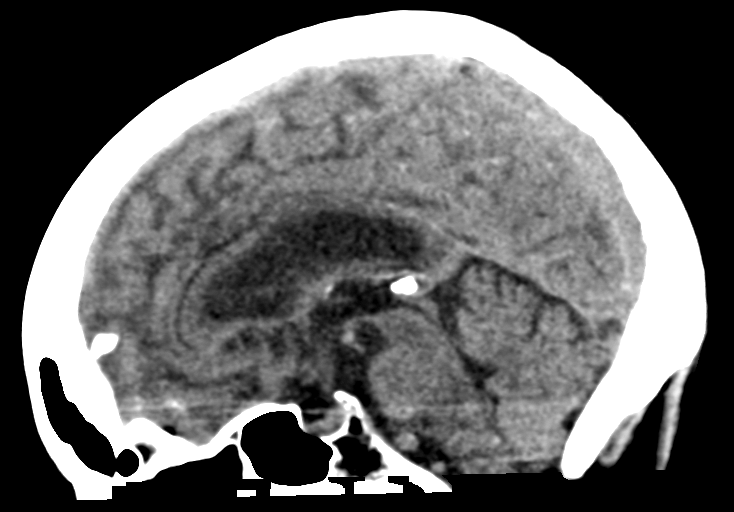
[im 33/50  brain]
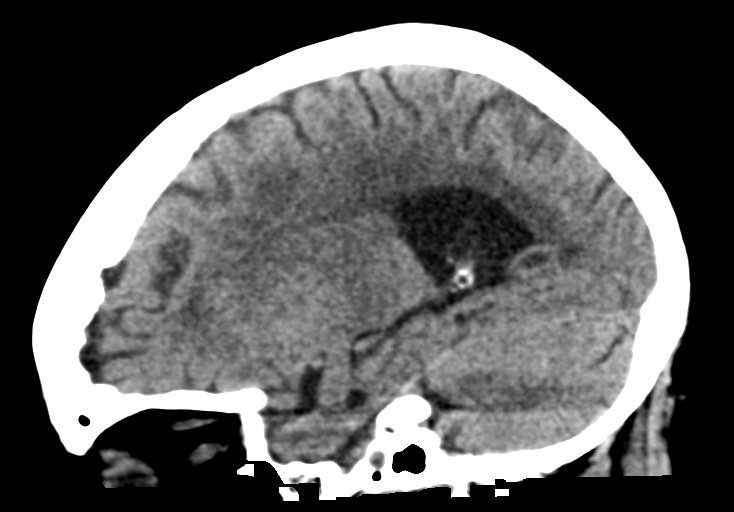

[15 of 43 positions shown; findings below may reference images not displayed]

FINDINGS: Brain: The ventricles are normal in configuration. There is
ventricular and sulcal enlargement reflecting mild to moderate
diffuse atrophy. No hydrocephalus.

There are no parenchymal masses or mass effect. There is no evidence
of a cortical infarct. Patchy white matter hypoattenuation is noted
consistent with moderate chronic microvascular ischemic change.

There are no extra-axial masses or abnormal fluid collections.

There is no intracranial hemorrhage.

Vascular: No hyperdense vessel or unexpected calcification.

Skull: Normal. Negative for fracture or focal lesion.

Sinuses/Orbits: No acute finding.

Other: None.
IMPRESSION: 1. No acute intracranial abnormalities. No skull fracture or
intracranial hemorrhage.
2. Atrophy and chronic microvascular ischemic change.

## 2018-05-02 IMAGING — CR DG C-ARM 61-120 MIN
1 series · 4 of 4 positions shown · non-contrast
Comparison: Right femur radiographs - 08/12/2016

CLINICAL DATA: Right distal femur ORIF.

EXAM:
DG C-ARM 61-120 MIN; RIGHT FEMUR 1 VIEW
FLUOROSCOPY TIME:  51 seconds

[Series 6001: (id) · 4 of 4 slices shown]
[im 1/4]
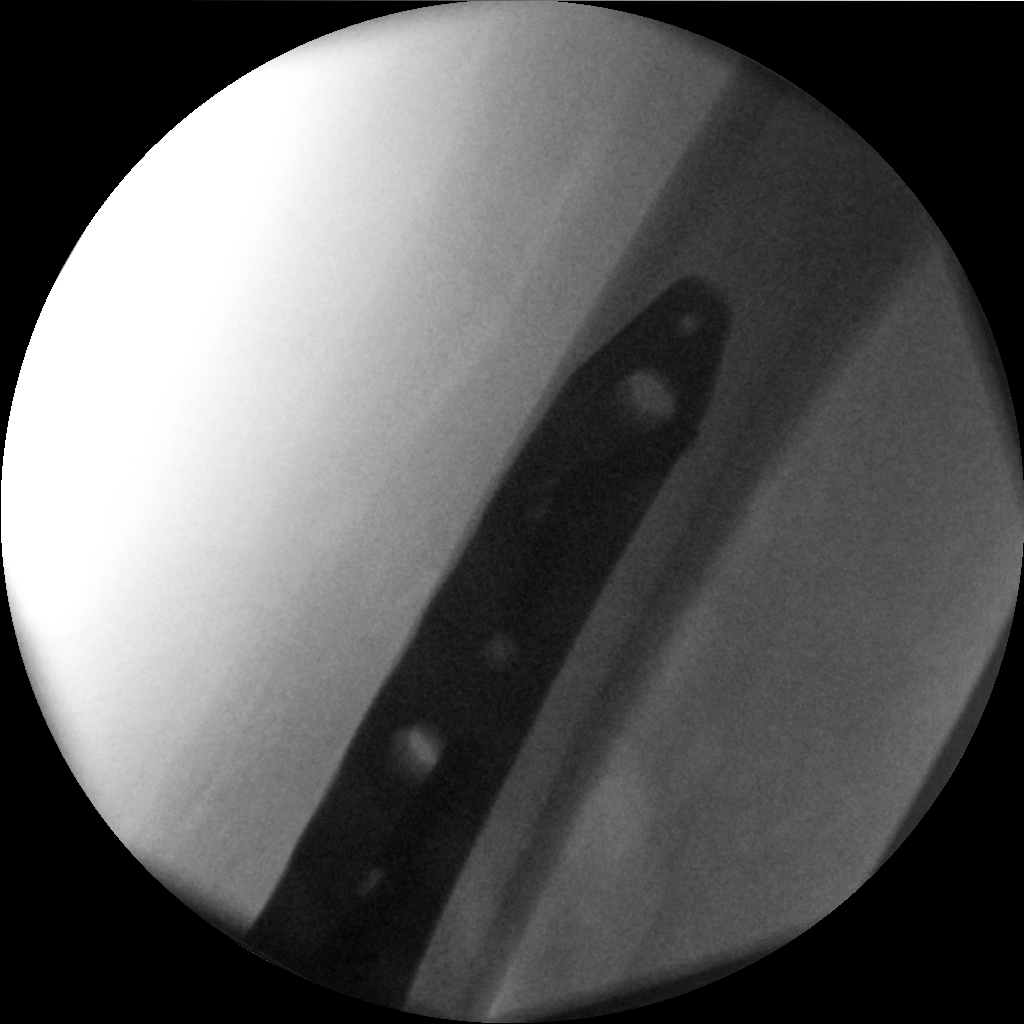
[im 2/4]
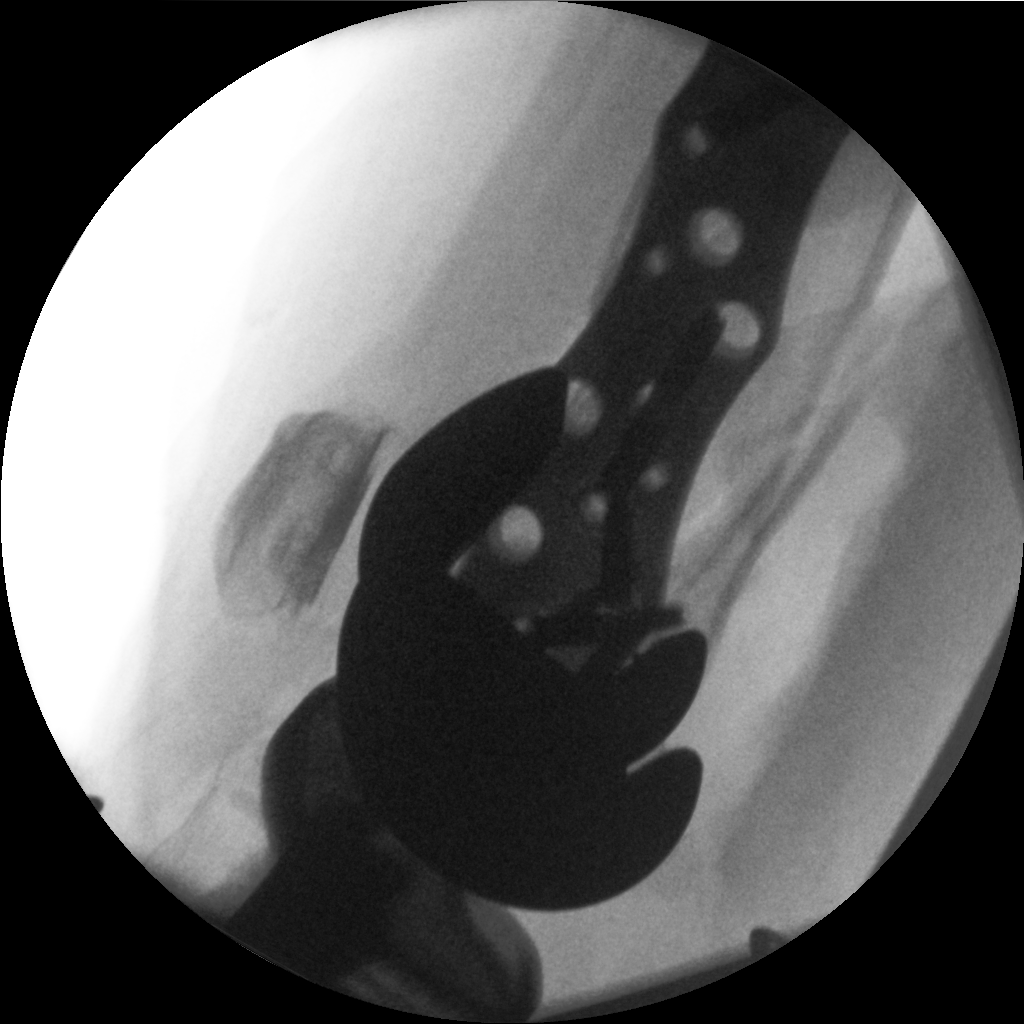
[im 3/4]
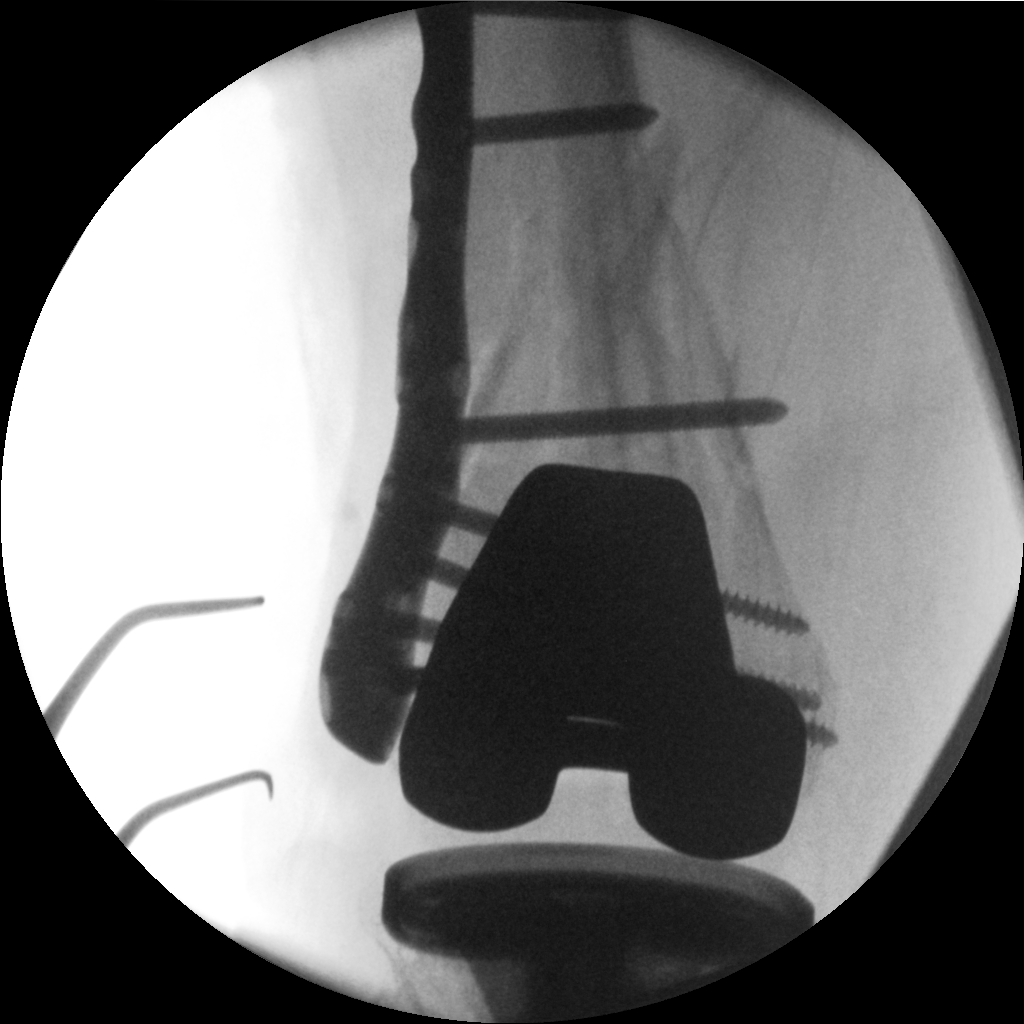
[im 4/4]
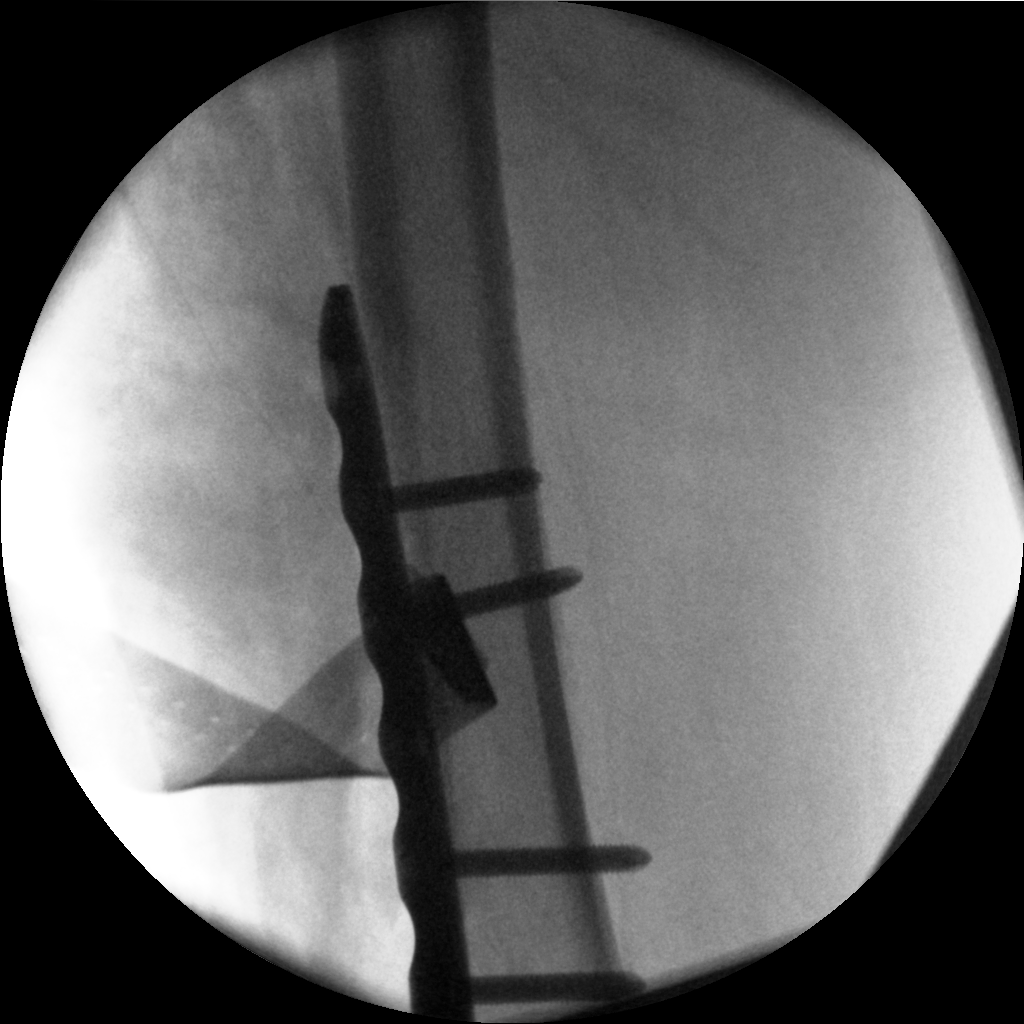

[4 of 4 positions shown; findings below may reference images not displayed]

FINDINGS: 4 spot intraoperative fluoroscopic images of the right knee are
provided for review.

Images demonstrate the sequela of sideplate fixation of known
comminuted distal femoral metaphysis seal fracture. The sideplate is
transfixed with at least 7 cancellous screws. There is minimal
residual displacement of the fracture fragments (seen best in the
provided lateral radiograph) though overall there is marked
improvement in alignment.

Expected adjacent subcutaneous emphysema. An apparent surgical drain
/ sponge is seen overlying the operative site on the provided more
cranial anterior projection image though this is not in seen on the
provided lateral radiograph and thus presumably external to the
patient.

Stable sequela of prior right total knee replacement, incompletely
evaluated.
IMPRESSION: Post sideplate fixation of comminuted distal femoral metaphysis of
fracture.

## 2022-02-20 ENCOUNTER — Ambulatory Visit (INDEPENDENT_AMBULATORY_CARE_PROVIDER_SITE_OTHER): Payer: Medicare PPO | Admitting: Internal Medicine

## 2022-02-20 VITALS — BP 132/77 | HR 73 | Resp 14 | Ht 64.0 in | Wt 195.0 lb

## 2022-02-20 DIAGNOSIS — Z9989 Dependence on other enabling machines and devices: Secondary | ICD-10-CM | POA: Diagnosis not present

## 2022-02-20 DIAGNOSIS — Z7189 Other specified counseling: Secondary | ICD-10-CM | POA: Insufficient documentation

## 2022-02-20 DIAGNOSIS — G4733 Obstructive sleep apnea (adult) (pediatric): Secondary | ICD-10-CM | POA: Diagnosis not present

## 2022-02-20 DIAGNOSIS — I480 Paroxysmal atrial fibrillation: Secondary | ICD-10-CM

## 2022-02-20 NOTE — Progress Notes (Signed)
Sleep Medicine  ? ?Office Visit ? ?Patient Name: Angelica Lewis ?DOB: [birth year] ?MRN [id number] ? ? ? ?Chief Complaint: establish care  ? ?Brief History: ? ?Angelica Lewis presents with a 20+ year history of sleep apnea.   She stated a history  of needing cpap  for her low oxygen level at night. Sleep quality is ok. This is noted most nights.  The patient relates the following symptoms: increased somnolence and fatigue over last month are also present. The patient goes to sleep at midnight  - 1am and wakes up at 930am.  Currently using a Recalled System One CPAP @ 12 cmH2O, cflex 3, medium Simplus full face mask.  Complains of not enough rest and feels too tired to do anything when she wakes in the morning.Patient has noted no restlessness of her legs at night.  The patient  relates no unusual behavior during the night.  The patient reports a history of psychiatric problems. The Epworth Sleepiness Score is 11 out of 24 .  The patient relates  Cardiovascular risk factors include: atrial fibrillation.  The patient reports using her cpap nightly, machine reads an average of over 7 hours nightly. Her machine has been recalled and is over 26 years old. No PSG is available currently for review.  ? ? ? ?ROS ? ?General: (-) fever, (-) chills, (-) night sweat ?Nose and Sinuses: (-) nasal stuffiness or itchiness, (-) postnasal drip, (-) nosebleeds, (-) sinus trouble. ?Mouth and Throat: (-) sore throat, (-) hoarseness. ?Neck: (-) swollen glands, (-) enlarged thyroid, (-) neck pain. ?Respiratory: + cough, + shortness of breath, - wheezing. ?Neurologic: - numbness, - tingling. ?Psychiatric: + anxiety, + depression ?Sleep behavior: -sleep paralysis -hypnogogic hallucinations -dream enactment  ?    -vivid dreams -cataplexy -night terrors -sleep walking ? ? ?Current Medication: ?Outpatient Encounter Medications as of 02/20/2022  ?Medication Sig  ? insulin aspart (NOVOLOG) 100 UNIT/ML injection To fill VGo needs 3 vials per month  ? losartan  (COZAAR) 25 MG tablet Take 1 tablet by mouth daily.  ? pregabalin (LYRICA) 150 MG capsule Take by mouth.  ? Semaglutide,0.25 or 0.5MG /DOS, (OZEMPIC, 0.25 OR 0.5 MG/DOSE,) 2 MG/1.5ML SOPN INJECT 0.25 MG UNDER THE SKIN ONCE A WEEK. AFTER ONE MONTH, INCREASE TO 0.5MG  WEEKLY.  ? acetaminophen (TYLENOL) 325 MG tablet Take 325 mg by mouth every 6 (six) hours as needed for moderate pain. *Chronic pain*  ? apixaban (ELIQUIS) 2.5 MG TABS tablet Take 2.5 mg by mouth 2 (two) times daily. *Hold this medication for 48 hours starting today 08/12/16 per MD*  ? aspirin 81 MG chewable tablet Chew 81 mg by mouth daily.  ? carboxymethylcellulose (REFRESH PLUS) 0.5 % SOLN Place 1 drop into both eyes 4 (four) times daily as needed.  ? celecoxib (CELEBREX) 200 MG capsule Take 1 capsule (200 mg total) by mouth every 12 (twelve) hours.  ? Cholecalciferol (VITAMIN D-3) 1000 units CAPS Take 1,000 Units by mouth daily.  ? co-enzyme Q-10 30 MG capsule Take 30 mg by mouth daily.  ? Cyanocobalamin 1000 MCG TBCR Take 1,000 mcg by mouth daily.  ? diltiazem (CARDIZEM CD) 120 MG 24 hr capsule Take 120 mg by mouth daily.  ? Dulaglutide (TRULICITY) 1.5 MG/0.5ML SOPN Inject 1.5 mg into the skin once a week.   ? ferrous sulfate 325 (65 FE) MG tablet Take 325 mg by mouth daily.  ? gabapentin (NEURONTIN) 300 MG capsule Take 600 mg by mouth at bedtime.  ? hydrochlorothiazide (HYDRODIURIL) 12.5 MG tablet Take by  mouth.  ? HYDROcodone-acetaminophen (NORCO) 7.5-325 MG tablet Take 1-2 tablets by mouth every 4 (four) hours as needed (breakthrough pain).  ? hydrocortisone cream 1 % Apply 1 application topically 2 (two) times daily.  ? insulin lispro (HUMALOG) 100 UNIT/ML injection Inject into the skin See admin instructions. Use as directed before meals and at bedtime per sliding scale. ?BS: less than 60= Call MD ?             151-200= 2 units, ?             201-250= 4 units, ?             251-300= 6 units, ?             301-350= 8 units, ?             351-400=  10 units, ?Greater than 400= 12 units and Call MD.  ? lidocaine (LIDODERM) 5 % Place 1 patch onto the skin daily. Remove & Discard patch within 12 hours or as directed by MD  ? losartan (COZAAR) 25 MG tablet Take 25 mg by mouth daily.  ? lovastatin (MEVACOR) 20 MG tablet Take 20 mg by mouth daily.  ? Multiple Vitamin (MULTIVITAMIN) tablet Take 1 tablet by mouth daily.  ? omeprazole (PRILOSEC) 20 MG capsule Take 20 mg by mouth daily.  ? POLYETHYLENE GLYCOL 3350 PO Take 17 g by mouth daily as needed. For constipation. *Mix in 4-8 oz. of fluid*  ? promethazine (PHENERGAN) 12.5 MG tablet Take 12.5 mg by mouth every 6 (six) hours as needed for nausea.  ? senna (SENOKOT) 8.6 MG TABS tablet Take 17.2 mg by mouth at bedtime as needed for mild constipation or moderate constipation.  ? sertraline (ZOLOFT) 100 MG tablet Take 100 mg by mouth daily.  ? sotalol (BETAPACE) 160 MG tablet Take 160 mg by mouth 2 (two) times daily.  ? vitamin C (ASCORBIC ACID) 500 MG tablet Take 500 mg by mouth daily.  ? ?No facility-administered encounter medications on file as of 02/20/2022.  ? ? ?Surgical History: ?Past Surgical History:  ?Procedure Laterality Date  ? ORIF FEMUR FRACTURE Right 08/13/2016  ? Procedure: OPEN REDUCTION INTERNAL FIXATION (ORIF) DISTAL FEMUR FRACTURE;  Surgeon: Deeann Saint, MD;  Location: [facility] ORS;  Service: Orthopedics;  Laterality: Right;  ? ? ?Medical History: ?Past Medical History:  ?Diagnosis Date  ? Age-related osteoporosis with current pathological fracture of right humerus   ? Chronic pain syndrome   ? Diabetes mellitus without complication (HCC)   ? GERD (gastroesophageal reflux disease)   ? Hypertension   ? Other iron deficiency anemias   ? Paroxysmal A-fib (HCC)   ? ? ?Family History: ?Non contributory to the present illness ? ?Social History: ?Social History  ? ?Socioeconomic History  ? Marital status: Divorced  ?  Spouse name: Not on file  ? Number of children: Not on file  ? Years of education: Not on file   ? Highest education level: Not on file  ?Occupational History  ? Not on file  ?Tobacco Use  ? Smoking status: Never  ? Smokeless tobacco: Never  ?Substance and Sexual Activity  ? Alcohol use: No  ? Drug use: No  ? Sexual activity: Never  ?  Birth control/protection: Abstinence  ?Other Topics Concern  ? Not on file  ?Social History Narrative  ? Not on file  ? ?Social Determinants of Health  ? ?Financial Resource Strain: Not on file  ?Food Insecurity: Not on  file  ?Transportation Needs: Not on file  ?Physical Activity: Not on file  ?Stress: Not on file  ?Social Connections: Not on file  ?Intimate Partner Violence: Not on file  ? ? ?Vital Signs: ?Blood pressure 132/77, pulse 73, resp. rate 14, height 5\' 4"  (1.626 m), weight 195 lb (88.5 kg), SpO2 98 %. ?Body mass index is 33.47 kg/m?.  ? ?Examination: ?General Appearance: The patient is well-developed, well-nourished, and in no distress. ?Neck Circumference: 38.5 cm ?Skin: Gross inspection of skin unremarkable. ?Head: normocephalic, no gross deformities. ?Eyes: no gross deformities noted. ?ENT: ears appear grossly normal ?Neurologic: Alert and oriented. No involuntary movements. ? ? ? ?EPWORTH SLEEPINESS SCALE: ? ?Scale:  ?(0)= no chance of dozing; (1)= slight chance of dozing; (2)= moderate chance of dozing; (3)= high chance of dozing ? ?Chance  Situtation ?   ?Sitting and reading: 3 ?  ? Watching TV: 0 ?   ?Sitting Inactive in public: 0 ?   ?As a passenger in car: 2   ?   ?Lying down to rest: 3 ?   ?Sitting and talking: 0 ?   ?Sitting quielty after lunch: 3 ?   ?In a car, stopped in traffic: 0 ? ? ?TOTAL SCORE:   11 out of 24 ? ? ? ?SLEEP STUDIES: ? ?None on file ? ? ?CPAP COMPLIANCE DATA: ? ?Date Range: 02/3222 - 02/19/22 ? ?Average Daily Use: 7:36 hours ?Data read off machine, not downloading properly and has stopped recording data.  Patient said she   is still trying to use nightly. ? ? ?LABS: ?No results found for this or any previous visit (from the past 2160  hour(s)). ? ?Radiology: ?ECHOCARDIOGRAM COMPLETE ? ?Result Date: 08/14/2016 ?                [facility]*                       [address]                        [place]

## 2022-02-20 NOTE — Patient Instructions (Signed)

## 2024-08-25 NOTE — Progress Notes (Signed)
 [facility] Cardiology at [facility] [address] [place], [place] [address]  Phone: [phone] Fax: [phone]  Date of Service: 08/26/2024  Patient Clinic Note  PCP: Referring Provider:  Noorani, Sezmin Shamsherali, MD [address] [place] [address] Phone: [phone] Fax: [phone] Angelica Joycelyn Maples, MD [address] [place],  [place] [address] Phone: [phone] Fax: [phone]    Assessment and Plan:   Angelica Lewis is a 89 y.o. female with past medical history of heart failure with preserved ejection fraction, atrial fibrillation, type 2 diabetes, OSA not using CPAP, who presents for cardiology evaluation.  Assessment & Plan Heart failure with preserved ejection fraction  Mild to moderately dilated right ventricle with mildly reduced function Heart failure with preserved ejection fraction, experiencing peripheral edema recently along with some exertional dyspnea, improving with a few visits to diuresis clinic and increase in her Lasix dosing.  This is all new for her and thus echocardiogram has been scheduled which is set for tomorrow. - Appears largely euvolemic today.  No changes were made to her diuretic regimen.  Electrolytes reviewed and stable from her visit last week. - Of note left lower extremity is chronically swollen around 1+ edema.  Right lower extremity with no edema today.  No recent exacerbation of shortness of breath or orthopnea. Urinary incontinence affects medication choices. - Continue Lasix 120 mg daily. - Continue spironolactone 25 mg daily. - Holding on SGLT2 inhibitor due to urinary incontinence - Schedule echocardiogram for tomorrow to reevaluate LVEF and assess fluid overload issues.  Permanent atrial fibrillation Permanent atrial fibrillation, well-managed, confirmed by EKG. not bothersome to her.  Previously followed with EP, failed dofetilide and amiodarone.  Was hesitant to pursue A-fib  ablation. - Continue apixaban  for CVA prevention. - Continue diltiazem  240 mg twice a day.  Hyperlipidemia Coronary artery calcifications noted on CT chest Hyperlipidemia managed with medication. - Continue lovastatin.  No role for ischemic evaluation.  Abnormal echocardiogram - Last noting moderate TR and moderate pulmonary hypertension, although done in the setting of volume overload.  Repeat evaluation pending    Lab Results  Component Value Date   CHOL 143 10/02/2023   CHOL 156 07/11/2022   CHOL 163 10/19/2020   Lab Results  Component Value Date   HDL 59 10/02/2023   HDL 54 07/11/2022   HDL 48 10/19/2020   Lab Results  Component Value Date   LDL 66 10/02/2023   LDL 75 07/11/2022   LDL 65 10/19/2020   Lab Results  Component Value Date   VLDL 17.8 10/02/2023   VLDL 26.6 07/11/2022   VLDL 50 (H) 10/19/2020   Lab Results  Component Value Date   CHOLHDLRATIO 2.4 10/02/2023   CHOLHDLRATIO 2.9 07/11/2022   CHOLHDLRATIO 3.4 10/19/2020   Lab Results  Component Value Date   TRIG 89 10/02/2023   TRIG 133 07/11/2022   TRIG 250 (H) 10/19/2020    No results found for: LPA No results found for: APOB   The ASCVD Risk score (Arnett DK, et al., 2019) failed to calculate for the following reasons:   The 2019 ASCVD risk score is only valid for ages 83 to 21  Note: For patients with SBP <90 or >200, Total Cholesterol <130 or >320, HDL <20 or >100 which are outside of the allowable range, the calculator will use these upper or lower values to calculate the patient's risk score.   Lab Results  Component Value Date  A1C 6.6 03/05/2024     Return in about 4 weeks (around 09/23/2024).     Subjective:   Chief Complaint: Chief Complaint  Patient presents with  . Establish Care    Patient has been followed by Damien Lee at [facility] diuresis clinic.  Previous cardiologist Dr Cleotilde.  Hx of HFpEF.  Patient states she is here for a check up. Echocardiogram scheduled  for tomorrow.       Referring Provider: Noorani, Sezmin Shamshe*  History of Present Illness:   History of Present Illness Angelica Lewis is an 89 year old female with heart failure with preserved ejection fraction and atrial fibrillation who presents for cardiology evaluation.  She has a history of heart failure with preserved ejection fraction and has been attending the [facility] diuresis clinic. She was last seen on August 21, 2024, and received IV diuretics at that time. She takes Lasix 120 mg once daily, which causes frequent urination and has led to urinary incontinence, requiring her to change clothes multiple times a day. She notes some improvement in the last couple of days. Her left leg has been more swollen than the right since the swelling began. No recent worsening of breathing, chest pain, or shortness of breath, including when lying down. She has no history of heart attack or stroke.  She has permanent atrial fibrillation and is aware of being in AFib most of the time. She reports no significant issues related to AFib and is able to perform daily activities such as housework and gardening. She takes apixaban  for stroke prevention and diltiazem  240 mg twice a day.  She has type 2 diabetes and is managing her condition with medication, though specific details were not discussed.  She has a history of obstructive sleep apnea but is not using CPAP therapy.  She reports taking medication for Parkinson's disease, which causes her to feel 'like I'm sort of floating' and lightheaded. She feels better without the medication.       Cardiovascular History & Procedures: Cardiovascular Problems: HFpEF Hypertension Hyperlipidemia A-fib, permanent  Cath / PCI: None  CV Surgery: None  EP Procedures and Devices: None  Non-Invasive Evaluation(s): independently reviewed the most recent study of each modality.  Echocardiogram 04/2022-LV normal size with normal LVEF greater than 55%  mild aortic stenosis RV mildly to moderately dilated with mildly reduced systolic function moderate TR moderate pulmonary hypertension elevated right atrial pressure.   Medical History: Past Medical History[1]  Surgical History: Past Surgical History[2]  Social History:  reports that she has never smoked. She has never used smokeless tobacco. She reports that she does not drink alcohol  and does not use drugs. Pretty independent - lives with son -  Does gardening, housework  5 kids - 4 living  1 grandson    Family History: family history includes Aortic aneurysm in her father; Bipolar disorder in an other family member; Cancer in her brother; Diabetes in her brother, mother, and sister; Heart disease in her father, sister, and son; Hypertension in her brother, father, mother, and sister; Seizures in her sister; Stroke in her mother.  Review of Systems:  Except as noted in the HPI, the remainder of 10 systems reviewed is negative.   Allergies: Allergies[3]  Medications:  Prior to Admission medications  Medication Dose, Route, Frequency  acetaminophen  (TYLENOL ) 500 MG tablet 500 mg, Every 6 hours PRN  apixaban  (ELIQUIS ) 5 mg Tab 5 mg, Oral, 2 times a day (standard)  blood-glucose meter kit Disp. blood glucose meter  kit preferred by patient's insurance. Dx: Diabetes, E11.9  carboxymethylcellulose sodium (THERATEARS) 0.25 % Drop 1 drop, Both Eyes, 4 times a day PRN  cholecalciferol , vitamin D3-10 mcg, 400 unit,, 10 mcg (400 unit) cap 30 mcg, Daily (standard)  CRANBERRY ORAL 1 tablet, Daily (standard)  dilTIAZem  (CARDIZEM  CD) 240 MG 24 hr capsule 240 mg, Oral, 2 times a day  DULoxetine (CYMBALTA) 30 MG capsule 30 mg, Oral, 2 times a day (standard), TAKE 1 CAPSULE (30 MG TOTAL) BY MOUTH TWO (2) TIMES A DAY.  ergocalciferol -1,250 mcg, 50,000 unit, (DRISDOL) 1,250 mcg (50,000 unit) capsule 1,250 mcg, Oral, Weekly  erythromycin (ROMYCIN) 5 mg/gram (0.5 %) ophthalmic ointment Right Eye, 2  times a day  furosemide (LASIX) 80 MG tablet Takes as needed; pt has surplus. DO NO REFILL UNLESS PT ASKS Patient taking differently: 1.5 tablets (120 mg total) daily. Takes as needed; pt has surplus. DO NO REFILL UNLESS PT ASKS  insulin  degludec (TRESIBA FLEXTOUCH U-100) 100 unit/mL (3 mL) InPn 14 Units, Daily (standard)  Lactobacillus acidophilus (PROBIOTIC ORAL) 1 capsule, Daily (standard)  losartan  (COZAAR ) 25 MG tablet 25 mg, Oral, Daily (standard)  lovastatin (MEVACOR) 20 MG tablet TAKE 1 TABLET EVERY MONDAY, WEDNESDAY, AND FRIDAY  methocarbamol  (ROBAXIN ) 500 MG tablet 500 mg, Oral, 3 times a day PRN  omeprazole (PRILOSEC) 20 MG capsule 20 mg, Oral, Daily (standard)  pregabalin (LYRICA) 75 MG capsule TAKE 1 CAPSULE IN THE MORNING AND TAKE 2 CAPSULES IN THE EVENING  tirzepatide (MOUNJARO) 2.5 mg/0.5 mL PnIj 2.5 mg subcutaneous weekly  traMADol (ULTRAM) 50 mg tablet 50 mg, Oral, Every 6 hours PRN  budesonide (RINOCORT AQUA) 32 mcg/actuation nasal spray 1 spray, Nasal, Daily (standard)  carbidopa-levodopa CR, SINEMET CR, 25-100 mg extended release 25-100 mg per tablet 1 tablet, Oral, 2 times a day  pen needle, diabetic 32 gauge x 5/32 (4 mm) Ndle 1 each, Miscellaneous, Daily, E11.42 Ok to substitute whichever brand/size patient prefers or insurance covers.  senna (SENOKOT) 8.6 mg tablet 1 mg, Every other day  spironolactone (ALDACTONE) 25 MG tablet 25 mg, Oral, Daily (standard)     Objective:   Vitals BP 133/61 (BP Site: L Arm, BP Position: Sitting)   Pulse 57   Wt 72.1 kg (159 lb)   SpO2 95%   BMI 29.08 kg/m    Wt Readings from Last 3 Encounters:  08/26/24 72.1 kg (159 lb)  08/21/24 72.5 kg (159 lb 12.8 oz)  08/18/24 68.9 kg (152 lb)    Physical Exam General:  Pleasant female sitting in chair in nad.  Neck: Supple, JVP normal.  Resp:   CTAB bilaterally with normal WOB.  Cardio:  RRR without m/r/g.  Abdomen:   Soft, non-distended, non-tender.  Extremities: Warm  well-perfused bilaterally. RLE - no edema. LLE with 1+ LLE.   MSK: No joint swelling or erythema. No gross deformities.  Skin: No rashes  Neuro: CN II-XII grossly intact. Strength grossly intact.   Psych: Alert and oriented x3. Appropriate mood.    ECG (08/26/24) - independently interpreted most recent ECG in EPIC.  Atrial fibrillation, rate controlled, leftward axis deviation, poor R wave progression  Most Recent Labs  Lab Results  Component Value Date   NA 140 08/21/2024   K 4.1 08/21/2024   CL 100 08/21/2024   CO2 27.6 08/21/2024   Lab Results  Component Value Date   BUN/CP 19 06/22/2011   BUN 20 08/21/2024   BUN 22 08/07/2024   BUN 16 12/03/2014   BUN 23 (  H) 08/03/2014   Lab Results  Component Value Date   Creatinine Whole Blood, POC 1.0 05/23/2017   Creatinine/CP 0.83 06/22/2011   Creatinine 0.95 08/21/2024   Creatinine 0.92 08/07/2024   Creatinine 0.82 12/03/2014   Creatinine 0.99 08/03/2014   Lab Results  Component Value Date   PRO-BNP 1,088.0 (H) 08/21/2024   PRO-BNP 1,786.0 (H) 08/07/2024   PRO-BNP 360 08/03/2014   PRO-BNP 168 04/02/2014   Lab Results  Component Value Date   Cholesterol, Total 143 10/02/2023   Cholesterol, Total 175 12/03/2014   Triglycerides 89 10/02/2023   Triglycerides 315 (H) 04/21/2013   HDL 46 12/03/2014   Cholesterol, HDL 59 10/02/2023   Cholesterol, Non-HDL, Calculated 84 10/02/2023   LDL Direct 86 12/03/2014   Cholesterol, LDL, Calculated 66 10/02/2023          [1] Past Medical History: Diagnosis Date  . Allergic   . Allergic rhinitis due to allergen 05/11/2004  . Anxiety   . At risk for falls    ambulates with rollator, R knee pain  . Atrial fibrillation    (CMS-HCC) 07/05/2004   s/p ablation 03/2008 by [facility], but patient denies  . Basal cell carcinoma   . Cardiovascular disease [id number]   Congestive heart failure,  afib  . Cataract   . Cellulitis 06/10/2021  . CHF (congestive heart failure) (CMS-HCC) 2012   . Chronic pain disorder   . Closed fracture of left ankle 02/09/2021  . Clotting disorder (HHS-HCC)   . Dental caries   . Dermatochalasis of both upper eyelids 12/04/2022  . Disease of thyroid gland   . Diverticulosis of colon with hemorrhage 08/05/2007  . Dry eyes   . Dysthymic disorder 05/08/2011  . Esophageal reflux 02/25/2007  . Essential hypertension 07/28/1998   HTN IS TREATED THROUGH ACCORD DIABETES TRIAL; DR NORLEEN PHENIX [phone], X- [phone]; INTENSIVE TREATMENT ARM - SBP TARGET < 120 MM/HG   . Exposure keratoconjunctivitis of both eyes 12/04/2022  . Fibromyalgia 04/20/2013  . Fibromyalgia, primary   . Fracture of humerus 08/01/2016  . Gout   . Headache   . Hearing impairment    bilateral hearing aids  . Heart disease   . Hemorrhoids 03/25/2012  . History of nonmelanoma skin cancer 08/11/2014  . History of transfusion   . Hyperlipidemia 04/22/2012  . Impaired mobility    ambulates with rollator; motorized chair  . Irritable bowel syndrome (IBS)   . Joint pain   . Lack of access to transportation    family available for transportation  . Left lower quadrant abdominal pain 11/02/2020  . Low back pain 04/20/2011  . Migraine   . Nephrolithiasis 05/20/2014  . OA (osteoarthritis) of finger 10/31/2022  . Obesity 08/05/2007  . Obstructive sleep apnea 01/02/2012  . Osteoarthritis of knee   . Osteoporosis 2006  . Peripheral neuropathy   . Ptosis of both upper eyelids 12/04/2022  . Scoliosis   . Senile ectropion of both lower eyelids 12/04/2022  . Squamous cell skin cancer   . Type II diabetes mellitus (CMS-HCC) 07/28/1998  . Urinary incontinence   . Visual impairment    glasses  [2] Past Surgical History: Procedure Laterality Date  . ANKLE FRACTURE SURGERY    . BUNIONECTOMY  1989   hammertoe  . CARDIAC CATHETERIZATION  2009  . CATARACT EXTRACTION Bilateral ~2010  . CESAREAN SECTION    . CYSTOSCOPY W/ URETERAL STENT PLACEMENT  06/27/2010  . DILATION AND CURETTAGE OF  UTERUS  1960's, 1970s  x3  . ELBOW FRACTURE SURGERY    . EYE SURGERY Bilateral 1989   eyelid surgery  . FEMUR FRACTURE SURGERY    . FRACTURE SURGERY  2018  . HYSTERECTOMY  1970's  . JOINT REPLACEMENT    . PR FIX ECTROPION,ENTENSV LID REPAIR Bilateral 03/23/2023   Procedure: LATERAL CANTHAL TIGHTENING TARSAL STRIPE BILATERAL EYES;  Surgeon: Odella Toribio Del, MD;  Location: [facility];  Service: Ophthalmology  . PR FIX LID PTOSIS,FASANELLA-SERVAT Bilateral 03/23/2023   Procedure: REPAIR OF BLEPHAROPTOSIS; CONJUNCTIVO-TARSO-MULLER`S MUSCLE-LEVATOR RESECTION;  Surgeon: Odella Toribio Del, MD;  Location: [facility];  Service: Ophthalmology  . PR PERCUT FIX PROX/NECK FEMUR FX Right 11/23/2023   Procedure: PERCUTANEOUS SKELETAL FIXATION OF FEMORAL FRACTURE, PROXIMAL END, NECK;  Surgeon: Charleen Morene Fleeta Marvina, MD;  Location: OR [facility];  Service: Orthopedics  . PR REV UPPER EYELID W EXCESS SKIN Bilateral 03/23/2023   Procedure: BLEPHAROPLASTY UPPER; W/EXCESS SKIN WT DOWN LID;  Surgeon: Odella Toribio Del, MD;  Location: [facility];  Service: Ophthalmology  . REPLACEMENT TOTAL KNEE BILATERAL Bilateral 2004, 2005  . SKIN BIOPSY    [3] Allergies Allergen Reactions  . Ciprofloxacin Hives  . Doxycycline Hives       . Fentanyl  Other (See Comments)    Passed out  . Levaquin [Levofloxacin] Hives  . Morphine Swelling  . Penicillins Hives  . Adhesive Tape-Silicones Other (See Comments)  . Oxybenzone(Benzophenone 3) Other (See Comments)    Does not remember what happened  . Ace Inhibitors Cough  . Adhesive Rash  . Codeine Nausea Only  . Macrobid  [Nitrofurantoin  Monohyd/M-Cryst] Nausea And Vomiting  . Oxycontin [Oxycodone] Nausea Only

## 2024-08-28 NOTE — Progress Notes (Signed)
 [facility] IV Diuresis Clinic Note  Primary Cardiologist/Cardiology Provider(s):  Dr Quinton, Dr Jama PCP:  Noorani, Sezmin Shamsherali, MD  Last Cardiology Clinic Visit:  08/21/2024 Last IV Diuresis Visit: 08/21/24  Reason for Visit: Angelica Lewis is a 89 y.o. female with a history of HFpEF, who is being seen today in the [facility] IV Diuresis Clinic for an urgent visit for volume status optimization including IV diuresis.  Assessment/Plan: Acute on Chronic HFpEF, mod-severe TR - Volume status today is increased. 156.7 --> 152 --> 159.8 -->162.4 (has not taken Lasix in 4 days) - NYHA Class II - IV Lasix 120mg   was administered during clinic with good response - Labs were obtained: Cr 1.20, K 4.6, pro-BNP 1502 - Resume Lasix 120mg  daily - Continue spironolactone 25mg  daily - Daily weights at home - just got a battery for her scale - follow up with Dr Jama as scheduled in 1 mo  Response to IV diuresis:       Medications Heart rate Blood pressure Weight (lbs)  Pre  52 121/50 162.4  Hour 0 IV Lasix 120 mg     Hour 2 IV Lasix 0 mg     Post       Output: I/O       10/14 0701 10/15 0700 10/15 0701 10/16 0700 10/16 0701 10/17 0700         Urine Occurrence   1 x   Stool Occurrence   1 x       Return to clinic:   Return if symptoms worsen or fail to improve, for Diuresis Clinic.  Future Appointments  Date Time Provider Department Center  08/29/2024  1:10 PM Gwenn Powell Lesches, LCSW [facility]  09/03/2024  3:00 PM Charleen Morene Fleeta Marvina, MD [facility]  09/10/2024  2:00 PM Lesches Almarie Lou, MD [facility]  10/06/2024  1:00 PM Jama Old, MD [facility]  10/27/2024  2:40 PM Noorani, Sezmin Shamsherali, MD [facility]  11/24/2024  1:35 PM Argentina Geofm CROME, FNP [facility]  12/16/2024  9:00 AM Boccaccio, Delinda BRAVO, MD [facility]  12/29/2024  2:55 PM Manuelita Powell PARAS, FNP [facility]  01/28/2025  2:00 PM Quinton Nunzio Kluver, MD [facility]  04/08/2025  1:40 PM Mendsen, Ozell Ned, OD [facility]    History of Present Illness: Angelica Lewis is a 89 y.o. female with a history of HFpEF who presents today for IV diuresis. Patient last seen in clinic on 07/30/24 by Dr Quinton. At that visit, she was fluid overloaded. Unclear what her baseline weight is, but compared to her discharge weight in July, she was up at least 5 lbs. Discussed increasing lasix to twice daily, but she was afraid of her incontinence, thus was arranged for diuresis clinic.   07/31/24 she's been taking Lasix only about 3x a week. She felt like 80mg  every day was too much and she didn't think she should take it that often, therefore she was not taking it every day. She is swollen in her lower legs. She feels bloated. She is SOB on and off, not consistently. No orthopnea/PND.   08/07/24 Today presents for follow up. She is taking Lasix 80mg  every day. She is peeing a lot, but not much more than usual. Her weight has decreased, was 152 this morning at home. Had been as high as 159. Her swelling is improved. Bloating is a little better. She doesn't feel  SOB.   08/21/24 Has been taking Lasix 80mg  every day. Her legs are very swollen up. She hasn't been able to easily get her shoes on. She was going to take 2 lasix but was afraid to do it. She pees a lot on just the 80mg  lasix a day.SABRA She wets the bed and the floor, but the legs dont go down. In the morning they are swollen still when she gets up. She hasn't been weighing because her scale battery died. She is bloated. She is not SOB. She felt pretty good last night cooking dinner. No orthopnea/PND.   Today presents for follow up. She ended up seeing Dr Jama 2 days ago. Hasn't taken her Lasix in 4 days because of having drs appts and didn't want to risk wetting herself. She has been taking Lasix 120mg  a day. She is soaking through a  depends overnight and soaking the bed sheets as well. Her daughter is trying to get her a purewick. She just got a battery for her scale this morning so hasn't been weighing.  She denies SOB, orthopnea, and PND. She is bloated sometimes and that bothers her.  Eating a little more than normal, so thinks that explains some weight gain.    Past Medical History[1] Past Surgical History[2]  Current Medications[3] Allergies[4]  Objective:    Physical Exam BP 121/50 (BP Site: R Arm, BP Position: Sitting, BP Cuff Size: Large)   Pulse 52   Resp 20   Ht 157.5 cm (5' 2)   Wt 73.7 kg (162 lb 6.4 oz)   SpO2 96%   BMI 29.70 kg/m   Wt Readings from Last 12 Encounters:  08/28/24 73.7 kg (162 lb 6.4 oz)  08/26/24 72.1 kg (159 lb)  08/21/24 72.5 kg (159 lb 12.8 oz)  08/18/24 68.9 kg (152 lb)  08/07/24 68.9 kg (152 lb)  08/06/24 69.6 kg (153 lb 6.4 oz)  07/31/24 70.4 kg (155 lb 3.2 oz)  07/30/24 68.5 kg (151 lb)  07/07/24 68.5 kg (151 lb)  06/26/24 68.7 kg (151 lb 6.4 oz)  06/03/24 67.1 kg (148 lb)  05/23/24 66.2 kg (146 lb)    General:  Patient is well-appearing in no acute distress  Eyes:  Intact, sclerae anicteric.  Ears, nose, mouth: Benign  Respiratory:   Normal respiratory effort. Clear to auscultation bilaterally.  There are no wheezes.  Cardiovascular:  JVP about 1/2 up neck above clavicle with HOB at 90degrees.  Rate and rhythm are irregularly irregular.  There is no lifts or heaves.  Normal S1, S2. There is a grade II/VI systolic murmur, loudest at L/RSB.  Radial and pedal pulses are 2+, bilaterally.   There is 1+ pitting pedal/pretibial edema, left greater than right.   Gastrointestinal:   Soft, non-tender, with audible bowel sounds. Abdomen nondistended.  Liver is nonpalpable.  Musculoskeletal: No joint swelling  Skin: Warm, well perfused.  Neurologic: Appropriate mood and affect. Alert and oriented to person, place, and time. No gross motor or sensory deficits evident.    Recent Labs: Office Visit on 08/28/2024  Component Date Value Ref Range Status  . Magnesium  08/28/2024 2.1  1.6 - 2.6 mg/dL Final  . PRO-BNP 89/83/7974 1,502.0 (H)  <=300.0 pg/mL Final  . Sodium 08/28/2024 141  135 - 145 mmol/L Final  . Potassium 08/28/2024 4.6  3.4 - 4.8 mmol/L Final  . Chloride 08/28/2024 103  98 - 107 mmol/L Final  . CO2 08/28/2024 25.9  20.0 - 31.0 mmol/L Final  . Anion  Gap 08/28/2024 12  5 - 14 mmol/L Final  . BUN 08/28/2024 28 (H)  9 - 23 mg/dL Final  . Creatinine 89/83/7974 1.20 (H)  0.55 - 1.02 mg/dL Final  . BUN/Creatinine Ratio 08/28/2024 23   Final  . eGFR CKD-EPI (2021) Female 08/28/2024 43 (L)  >=60 mL/min/1.43m2 Final  . Glucose 08/28/2024 239 (H)  70 - 179 mg/dL Final  . Calcium 89/83/7974 9.2  8.7 - 10.4 mg/dL Final    Lab Results  Component Value Date   PRO-BNP 1,502.0 (H) 08/28/2024   PRO-BNP 1,088.0 (H) 08/21/2024   PRO-BNP 1,786.0 (H) 08/07/2024   PRO-BNP 990.0 (H) 07/30/2024   PRO-BNP 1,334.0 (H) 07/25/2023   PRO-BNP 360 08/03/2014   PRO-BNP 168 04/02/2014   PRO-BNP 292 09/23/2012   PRO-BNP 250 09/25/2011   Creatinine Whole Blood, POC 1.0 05/23/2017   Creatinine/CP 0.83 06/22/2011   Creatinine 1.20 (H) 08/28/2024   Creatinine 0.95 08/21/2024   Creatinine 0.92 08/07/2024   Creatinine 0.77 07/30/2024   Creatinine 0.79 05/26/2024   Creatinine 0.82 12/03/2014   Creatinine 0.99 08/03/2014   Creatinine 1.09 (H) 05/19/2014   Creatinine 0.91 03/13/2014   Creatinine 0.79 03/12/2014   BUN/CP 19 06/22/2011   BUN 28 (H) 08/28/2024   BUN 20 08/21/2024   BUN 22 08/07/2024   BUN 17 07/30/2024   BUN 18 05/26/2024   BUN 16 12/03/2014   BUN 23 (H) 08/03/2014   BUN 22 (H) 05/19/2014   BUN 19 03/13/2014   BUN 15 03/12/2014   Sodium 141 08/28/2024   Sodium 140 08/21/2024   Sodium 141 08/07/2024   Sodium 141 07/30/2024   Sodium 143 06/03/2024   Sodium 143 12/03/2014   Sodium 141 08/03/2014   Sodium 141 05/19/2014   Sodium 140 03/13/2014    Sodium 142 03/12/2014   Potassium/CP 4.3 06/22/2011   Potassium 4.6 08/28/2024   Potassium 4.1 08/21/2024   Potassium 3.5 08/07/2024   Potassium 3.9 07/30/2024   Potassium 4.2 05/26/2024   Potassium 4.2 12/03/2014   Potassium 4.5 08/03/2014   Potassium 4.2 05/19/2014   Potassium 3.9 03/13/2014   Potassium 3.9 03/12/2014   Magnesium  2.1 08/28/2024   Magnesium  2.1 08/21/2024   Magnesium  2.1 08/07/2024   Magnesium  2.0 05/26/2024   Magnesium  1.9 05/25/2024   Magnesium  1.9 12/03/2014   Magnesium  2.0 03/13/2014   Magnesium  2.0 03/12/2014   Magnesium  1.8 09/23/2012   Magnesium  2.0 03/22/2012    Metric Tracker: Did today's visit result in ED visit? No Did today's visit result in hospital admission? No Did today's visit result in referral to cardiology? n/a Today's visit was a referral from [facility]       [1] Past Medical History: Diagnosis Date  . Allergic   . Allergic rhinitis due to allergen 05/11/2004  . Anxiety   . At risk for falls    ambulates with rollator, R knee pain  . Atrial fibrillation    (CMS-HCC) 07/05/2004   s/p ablation 03/2008 by [facility]iord, but patient denies  . Basal cell carcinoma   . Cardiovascular disease [id number]   Congestive heart failure,  afib  . Cataract   . Cellulitis 06/10/2021  . CHF (congestive heart failure) (CMS-HCC) 2012  . Chronic pain disorder   . Closed fracture of left ankle 02/09/2021  . Clotting disorder (HHS-HCC)   . Dental caries   . Dermatochalasis of both upper eyelids 12/04/2022  . Disease of thyroid gland   . Diverticulosis of colon with hemorrhage 08/05/2007  .  Dry eyes   . Dysthymic disorder 05/08/2011  . Esophageal reflux 02/25/2007  . Essential hypertension 07/28/1998   HTN IS TREATED THROUGH ACCORD DIABETES TRIAL; DR NORLEEN PHENIX [phone], X- [phone]; INTENSIVE TREATMENT ARM - SBP TARGET < 120 MM/HG   . Exposure keratoconjunctivitis of both eyes 12/04/2022  . Fibromyalgia 04/20/2013  . Fibromyalgia, primary   .  Fracture of humerus 08/01/2016  . Gout   . Headache   . Hearing impairment    bilateral hearing aids  . Heart disease   . Hemorrhoids 03/25/2012  . History of nonmelanoma skin cancer 08/11/2014  . History of transfusion   . Hyperlipidemia 04/22/2012  . Impaired mobility    ambulates with rollator; motorized chair  . Irritable bowel syndrome (IBS)   . Joint pain   . Lack of access to transportation    family available for transportation  . Left lower quadrant abdominal pain 11/02/2020  . Low back pain 04/20/2011  . Migraine   . Nephrolithiasis 05/20/2014  . OA (osteoarthritis) of finger 10/31/2022  . Obesity 08/05/2007  . Obstructive sleep apnea 01/02/2012  . Osteoarthritis of knee   . Osteoporosis 2006  . Peripheral neuropathy   . Ptosis of both upper eyelids 12/04/2022  . Scoliosis   . Senile ectropion of both lower eyelids 12/04/2022  . Squamous cell skin cancer   . Type II diabetes mellitus (CMS-HCC) 07/28/1998  . Urinary incontinence   . Visual impairment    glasses  [2] Past Surgical History: Procedure Laterality Date  . ANKLE FRACTURE SURGERY    . BUNIONECTOMY  1989   hammertoe  . CARDIAC CATHETERIZATION  2009  . CATARACT EXTRACTION Bilateral ~2010  . CESAREAN SECTION    . CYSTOSCOPY W/ URETERAL STENT PLACEMENT  06/27/2010  . DILATION AND CURETTAGE OF UTERUS  1960's, 1970s   x3  . ELBOW FRACTURE SURGERY    . EYE SURGERY Bilateral 1989   eyelid surgery  . FEMUR FRACTURE SURGERY    . FRACTURE SURGERY  2018  . HYSTERECTOMY  1970's  . JOINT REPLACEMENT    . PR FIX ECTROPION,ENTENSV LID REPAIR Bilateral 03/23/2023   Procedure: LATERAL CANTHAL TIGHTENING TARSAL STRIPE BILATERAL EYES;  Surgeon: Odella Toribio Del, MD;  Location: [facility];  Service: Ophthalmology  . PR FIX LID PTOSIS,FASANELLA-SERVAT Bilateral 03/23/2023   Procedure: REPAIR OF BLEPHAROPTOSIS; CONJUNCTIVO-TARSO-MULLER`S MUSCLE-LEVATOR RESECTION;  Surgeon: Odella Toribio Del, MD;  Location: [facility];  Service: Ophthalmology  . PR PERCUT FIX PROX/NECK FEMUR FX Right 11/23/2023   Procedure: PERCUTANEOUS SKELETAL FIXATION OF FEMORAL FRACTURE, PROXIMAL END, NECK;  Surgeon: Charleen Morene Fleeta Marvina, MD;  Location: [facility];  Service: Orthopedics  . PR REV UPPER EYELID W EXCESS SKIN Bilateral 03/23/2023   Procedure: BLEPHAROPLASTY UPPER; W/EXCESS SKIN WT DOWN LID;  Surgeon: Odella Toribio Del, MD;  Location: [facility];  Service: Ophthalmology  . REPLACEMENT TOTAL KNEE BILATERAL Bilateral 2004, 2005  . SKIN BIOPSY    [3] Current Outpatient Medications  Medication Sig Dispense Refill  . acetaminophen  (TYLENOL ) 500 MG tablet Take 1 tablet (500 mg total) by mouth every six (6) hours as needed for pain.    . apixaban  (ELIQUIS ) 5 mg Tab Take 1 tablet (5 mg total) by mouth two (2) times a day. 180 tablet 3  . blood-glucose meter kit Disp. blood glucose meter kit preferred by patient's insurance. Dx: Diabetes, E11.9 1 each 11  . budesonide (RINOCORT AQUA) 32 mcg/actuation nasal spray 1 spray  into each nostril daily. 8 mL 1  . carbidopa-levodopa CR, SINEMET CR, 25-100 mg extended release 25-100 mg per tablet Take 1 tablet by mouth two (2) times a day. 60 tablet 6  . carboxymethylcellulose sodium (THERATEARS) 0.25 % Drop Administer 1 drop to both eyes 4 (four) times a day as needed. 30 mL 3  . cholecalciferol , vitamin D3-10 mcg, 400 unit,, 10 mcg (400 unit) cap Take 3 capsules (30 mcg total) by mouth daily.    SABRA CRANBERRY ORAL Take 1 tablet by mouth daily.    . dilTIAZem  (CARDIZEM  CD) 240 MG 24 hr capsule Take 1 capsule (240 mg total) by mouth two (2) times a day. 180 capsule 3  . DULoxetine (CYMBALTA) 30 MG capsule Take 1 capsule (30 mg total) by mouth two (2) times a day. TAKE 1 CAPSULE (30 MG TOTAL) BY MOUTH TWO (2) TIMES A DAY. 180 capsule 3  . furosemide (LASIX) 80 MG tablet Takes as needed; pt has surplus. DO NO REFILL UNLESS PT ASKS  (Patient taking differently: 1.5 tablets (120 mg total) daily. Takes as needed; pt has surplus. DO NO REFILL UNLESS PT ASKS)    . insulin  degludec (TRESIBA FLEXTOUCH U-100) 100 unit/mL (3 mL) InPn Inject 0.14 mL (14 Units total) under the skin daily.    . Lactobacillus acidophilus (PROBIOTIC ORAL) Take 1 capsule by mouth daily.    . losartan  (COZAAR ) 25 MG tablet Take 1 tablet (25 mg total) by mouth daily. 90 tablet 3  . lovastatin (MEVACOR) 20 MG tablet TAKE 1 TABLET EVERY MONDAY, WEDNESDAY, AND FRIDAY 39 tablet 3  . methocarbamol  (ROBAXIN ) 500 MG tablet Take 1 tablet (500 mg total) by mouth Three (3) times a day as needed. 30 tablet 1  . omeprazole (PRILOSEC) 20 MG capsule Take 1 capsule (20 mg total) by mouth daily. 90 capsule 0  . pen needle, diabetic 32 gauge x 5/32 (4 mm) Ndle 1 each by Miscellaneous route in the morning. E11.42 Ok to substitute whichever brand/size patient prefers or insurance covers.. 100 each 3  . pregabalin (LYRICA) 75 MG capsule TAKE 1 CAPSULE IN THE MORNING AND TAKE 2 CAPSULES IN THE EVENING 270 capsule 0  . senna (SENOKOT) 8.6 mg tablet Take 1 mg by mouth every other day.    . spironolactone (ALDACTONE) 25 MG tablet Take 1 tablet (25 mg total) by mouth daily. 90 tablet 3  . tirzepatide (MOUNJARO) 2.5 mg/0.5 mL PnIj 2.5 mg subcutaneous weekly 2 mL 11  . traMADol (ULTRAM) 50 mg tablet Take 1 tablet (50 mg total) by mouth every six (6) hours as needed for pain. 20 tablet 0   No current facility-administered medications for this visit.  [4] Allergies Allergen Reactions  . Ciprofloxacin Hives  . Doxycycline Hives       . Fentanyl  Other (See Comments)    Passed out  . Levaquin [Levofloxacin] Hives  . Morphine Swelling  . Penicillins Hives  . Adhesive Tape-Silicones Other (See Comments)  . Oxybenzone(Benzophenone 3) Other (See Comments)    Does not remember what happened  . Ace Inhibitors Cough  . Adhesive Rash  . Codeine Nausea Only  . Macrobid   [Nitrofurantoin  Monohyd/M-Cryst] Nausea And Vomiting  . Oxycontin [Oxycodone] Nausea Only

## 2024-08-29 NOTE — Telephone Encounter (Signed)
 Please call patient and let her know that I just spoke with one of my colleagues that does more geriatric care and she mentioned that the [employer] system is available online.  I have asked our social workers to get in touch with her to go over any additional resources that could be helpful.  When you have a minute, can you please go to their site and see if they take [facility] Medicare coverage?  I feel like you might have already done this but cannot remember.  In addition, can you please ask if I can refer her to urogynecology for evaluation?  Unfortunately the [facility] urogynecology group moved to [place] so the [facility] urogynecology location is much closer.  I also think they may have suggestions for supplies that could be helpful too.  Lastly, can you gather some more clinical information from the patient?  If she leaking through the depends?  Have there been any new changes in her urinary habits?

## 2024-08-29 NOTE — Telephone Encounter (Signed)
 Hi guys, I have an 89 year old patient who is really struggling at home.  This family really needs help with resources.  Her daughter is also unwell and having difficulty taking care of her mother.  Can one of you please please reach out to them?  The daughter's name is Darice.  Information attached to this message.

## 2024-08-29 NOTE — Telephone Encounter (Signed)
 Referral placed, thank you, sezmin

## 2024-09-03 NOTE — Progress Notes (Signed)
  Thank you for reaching out to the community health worker program. Unfortunately, we will not be able to assist with this referral due to scope and it will be cancelled. Community health workers are unable to assist with obtaining DME. The referral would be most appropriate for the Primary Care Provider to place an order for the DME to a medical supply company.  I am happy to send the patient some local medical supply comapanies.    Community health workers can support patients needing ongoing help with connecting to resources in their communities regarding SDOH needs, including but not limited to, transportation, food resources, application assistance, obtaining medication, home repairs, and connecting to primary care.

## 2024-09-03 NOTE — Progress Notes (Signed)
 ORTHOPAEDIC TRAUMA SERVICE POST-OPERATIVE CLINIC NOTE  Diagnosis: Right valgus impacted femoral neck fracture  Surgery: in situ fixation of femoral neck fracture with percutaneous cannulated screws  Date of Surgery: 11/23/2023   Subjective: This is a 89 y.o. female now 10 months s/p the above surgery presenting for scheduled follow-up.    She is very happy overall.  She denies any hip pain she is ambulating with a walker.  She reports no new concerns related to her hip. She was recently diagnosed with Parkinson's and is undergoing treatment.  She denies any new numbness or tingling.  Focused Physical Exam:   General: No acute distress   Right Lower Extremity:  Ankle plantarflexion and dorsiflexion is intact Fires EHL and FHL Sensation is intact to light touch in superficial peroneal, deep peroneal, sural, saphenous, and tibial nerve distributions 2+ dorsalis pedis pulse Calf is soft and nontender to palpation   Imaging: I ordered and independently reviewed and interpreted x-rays of the right hip which demonstrate no interval fracture displacement or collapse. 3 screws without adverse features.   Assessment: This is a 89 y.o. female now roughly 10 months status post the above described procedures. Doing well overall.   Plan:  1.   Continue weightbearing as tolerated to the right lower extremity, no restrictions 2.   Continue working on safely ambulating and doing exercises to improve strength and balance 3.   Continue bone health per PCP 4.   Follow-up in 6 months   The patient expressed understanding and had all questions answered.   Scheduled Follow Up: Future Appointments  Date Time Provider Department Center  09/03/2024  3:00 PM Charleen Morene Fleeta Marvina, MD [facility]  09/10/2024  2:00 PM Arloa Almarie Lou, MD [facility]  10/06/2024  1:00 PM Jama Old, MD [facility]  10/27/2024  2:40 PM Noorani, Sezmin  Shamsherali, MD [facility]  11/24/2024  1:35 PM Argentina Geofm CROME, FNP [facility]  12/16/2024  9:00 AM Boccaccio, Delinda BRAVO, MD [facility]  12/29/2024  2:55 PM Manuelita Powell PARAS, FNP [facility]  01/28/2025  2:00 PM Quinton Nunzio Kluver, MD [facility]  04/08/2025  1:40 PM Mendsen, Ozell Ned, OD [facility]     X-rays to be ordered at next visit: XR right hip  Attending Attestation: I saw and evaluated the patient, participating in the key portions of the service.   I discussed the findings and plan with the patient. I determined the assessment and plan of care for the patient.    I reviewed and agree with the documented findings and plan in the note above.  Odis Charleen, MD Orthopaedic Trauma  Department of Orthopaedic Surgery

## 2024-09-04 NOTE — Telephone Encounter (Signed)
 Called numbers below.  Was able to reach [place] and explained patient cpap supply needs.  There was miscommunication about equipment as well as whether machine and/or supplies needed.  After Olam did extensive research, it appears that she has not ever had a CPAP machine from [facility] therefore machine repair is not needed and furthermore she is probably overdue for replacement machine.  She will let the therapist know to proceed with machine and supplies.  Therapist may reach out to you [place] with additional questions with setting up timings if needed. Thank you, Sezmin

## 2024-09-04 NOTE — Telephone Encounter (Signed)
 Lets hold off on [employer].  Just got off the telephone with supervisor respiratory therapist at [facility].  Apparently it was just a simple miscommunication and what the patient needed.  I have explained the CPAP machine is broken and they need a new machine and supplies.  I have added an addendum to that effect.  It is clear that the only reason she is not using it is because it is not working.  They will get a therapist to take CPAP and supplies out to her house.  Thank you, sezmin

## 2024-09-05 ENCOUNTER — Emergency Department

## 2024-09-05 ENCOUNTER — Encounter: Payer: Self-pay | Admitting: Emergency Medicine

## 2024-09-05 ENCOUNTER — Other Ambulatory Visit: Payer: Self-pay

## 2024-09-05 ENCOUNTER — Emergency Department: Admission: EM | Admit: 2024-09-05 | Discharge: 2024-09-05 | Disposition: A | Discharge status: Home / Self Care

## 2024-09-05 DIAGNOSIS — Z7901 Long term (current) use of anticoagulants: Secondary | ICD-10-CM | POA: Diagnosis not present

## 2024-09-05 DIAGNOSIS — I4891 Unspecified atrial fibrillation: Secondary | ICD-10-CM | POA: Diagnosis not present

## 2024-09-05 DIAGNOSIS — S4992XA Unspecified injury of left shoulder and upper arm, initial encounter: Secondary | ICD-10-CM | POA: Diagnosis present

## 2024-09-05 DIAGNOSIS — S0990XA Unspecified injury of head, initial encounter: Secondary | ICD-10-CM | POA: Diagnosis not present

## 2024-09-05 DIAGNOSIS — I509 Heart failure, unspecified: Secondary | ICD-10-CM | POA: Diagnosis not present

## 2024-09-05 DIAGNOSIS — W19XXXA Unspecified fall, initial encounter: Secondary | ICD-10-CM

## 2024-09-05 DIAGNOSIS — E041 Nontoxic single thyroid nodule: Secondary | ICD-10-CM | POA: Diagnosis not present

## 2024-09-05 DIAGNOSIS — I6782 Cerebral ischemia: Secondary | ICD-10-CM | POA: Insufficient documentation

## 2024-09-05 DIAGNOSIS — S42292A Other displaced fracture of upper end of left humerus, initial encounter for closed fracture: Secondary | ICD-10-CM | POA: Insufficient documentation

## 2024-09-05 MED ORDER — HYDROCODONE-ACETAMINOPHEN 5-325 MG PO TABS
1.0000 | ORAL_TABLET | Freq: Once | ORAL | Status: AC
  Administered 2024-09-05: 1 via ORAL
  Filled 2024-09-05: qty 1

## 2024-09-05 MED ORDER — HYDROCODONE-ACETAMINOPHEN 5-300 MG PO TABS: 1.0000 | ORAL_TABLET | Freq: Four times a day (QID) | ORAL | 0 refills | Status: AC | PRN

## 2024-09-05 MED ORDER — GABAPENTIN 300 MG PO CAPS
300.0000 mg | ORAL_CAPSULE | Freq: Once | ORAL | Status: AC
  Administered 2024-09-05: 300 mg via ORAL
  Filled 2024-09-05: qty 1

## 2024-09-05 MED ORDER — ONDANSETRON 4 MG PO TBDP: 4.0000 mg | ORAL_TABLET | Freq: Three times a day (TID) | ORAL | 0 refills | Status: AC | PRN

## 2024-09-05 MED ORDER — ONDANSETRON 4 MG PO TBDP
4.0000 mg | ORAL_TABLET | Freq: Once | ORAL | Status: AC
  Administered 2024-09-05: 4 mg via ORAL
  Filled 2024-09-05: qty 1

## 2024-09-05 NOTE — Discharge Instructions (Addendum)
 You were seen in the emergency department after a fall resulting in a left humerus fracture.  Use your sling is much as possible but you may remove this for hygiene.  Call orthopedics first thing on Monday and arrange follow-up.  I have prescribed some opioid pain medication and make sure your family is aware to watch you as this can put you at increased risk for falls in the future.  Do not take Tylenol  if you take this medication as it has Tylenol  in it.  You are okay to supplement with ibuprofen.  You do have a 3.8 cm thyroid nodule on the left that will need an outpatient thyroid ultrasound.  Please let your primary care physician know.  Return with any acutely worsening symptoms or any other emergency. -- RETURN PRECAUTIONS & AFTERCARE: (ENGLISH) RETURN PRECAUTIONS: Return immediately to the emergency department or see/call your doctor if you feel worse, weak or have changes in speech or vision, are short of breath, have fever, vomiting, pain, bleeding or dark stool, trouble urinating or any new issues. Return here or see/call your doctor if not improving as expected for your suspected condition. FOLLOW-UP CARE: Call your doctor and/or any doctors we referred you to for more advice and to make an appointment. Do this today, tomorrow or after the weekend. Some doctors only take PPO insurance so if you have HMO insurance you may want to contact your HMO or your regular doctor for referral to a specialist within your plan. Either way tell the doctor's office that it was a referral from the emergency department so you get the soonest possible appointment.  YOUR TEST RESULTS: Take result reports of any blood or urine tests, imaging tests and EKG's to your doctor and any referral doctor. Have any abnormal tests repeated. Your doctor or a referral doctor can let you know when this should be done. Also make sure your doctor contacts this hospital to get any test results that are not currently available such as  cultures or special tests for infection and final imaging reports, which are often not available at the time you leave the ER but which may list additional important findings that are not documented on the preliminary report. BLOOD PRESSURE: If your blood pressure was greater than 120/80 have your blood pressure rechecked within 1 to 2 weeks. MEDICATION SIDE EFFECTS: Do not drive, walk, bike, take the bus, etc. if you have received or are being prescribed any sedating medications such as those for pain or anxiety or certain antihistamines like Benadryl. If you have been give one of these here get a taxi home or have a friend drive you home. Ask your pharmacist to counsel you on potential side effects of any new medication

## 2024-09-05 NOTE — ED Triage Notes (Signed)
 Presents via EMS fro  home  Was sitting outside  and slipped  [place] on ground  Hitting head   No,LOC Also hitting left upper arm on a rock

## 2024-09-05 NOTE — ED Provider Notes (Signed)
 [facility] Provider Note    Event Date/Time   First MD Initiated Contact with Patient 09/05/24 1456     (approximate)   History   Fall   HPI  Angelica Lewis is a 89 y.o. female with a past medical history of A-fib on apixaban , CHF, hyperlipidemia who presents to the emergency department with left shoulder pain and neck pain after a fall from sitting.  Patient was in her usual state of health and was sitting in a rollator out on her yarn when the rollator slipped out from underneath her and she fell on her left side.  She did hit her head and landed on her left shoulder.  Denies any loss of consciousness.  Denies any headache or hearing or vision changes but does report 10 out of 10 pain in her left shoulder and 3 out of 10 pain in her left neck.  She was in her usual state of health prior to this.  Denies any sensation changes in her left hand or extremity      Physical Exam   Triage Vital Signs: ED Triage Vitals  Encounter Vitals Group     BP      Girls Systolic BP Percentile      Girls Diastolic BP Percentile      Boys Systolic BP Percentile      Boys Diastolic BP Percentile      Pulse      Resp      Temp      Temp src      SpO2      Weight      Height      Head Circumference      Peak Flow      Pain Score      Pain Loc      Pain Education      Exclude from Growth Chart     Most recent vital signs: Vitals:   09/05/24 1600 09/05/24 1630  BP: (!) 140/66 (!) 144/60  Pulse:    Resp:    Temp:    SpO2:      Nursing Triage Note reviewed. Vital signs reviewed and patients oxygen saturation is normoxic  General: Patient is well nourished, well developed, awake and alert, appears slightly uncomfortable Head: Normocephalic and atraumatic Eyes: Normal inspection, extraocular muscles intact, no conjunctival pallor Ear, nose, throat: Normal external exam Neck: Arrives without a c-collar, has left neck paraspinal tenderness to palpation no  midline tenderness to palpation, soft c-collar placed Respiratory: Patient is in no respiratory distress, lungs CTAB Cardiovascular: Patient is not tachycardic, RRR without murmur appreciated GI: Abd SNT with no guarding or rebound  Back: Normal inspection of the back with good strength and range of motion throughout all ext Extremities: pulses intact with good cap refills, no LE pitting edema or calf tenderness Obvious deformity of left shoulder and very tenderness to palpation over left proximal humerus, able to flex extend elbow, no open lacerations or wounds.  2+ radial pulse, all compartments soft, able to flex extend fingers without difficulty  HAND EXAM Symmetrically palpable radial and ulnar pulses. Capillary refill <2 seconds to all digits. Intact sensation to light touch of the radial, median and ulnar nerves demonstrated by testing in the dorsal web space of the thumb, the distal palmar aspect of the index finger, and the lateral surface of the fifth finger. 2 point discrimination intact to 5mm (up to 6mm can be normal in digits 3-5) of discrimination  in the affected digit. Intact motor function of the radial, median and ulnar nerves demonstrated by strength of extension of the isolated distal joint of the index finger, hand grip, and spreading of the 2nd through 5th digits. Intact recurrent median nerve as demonstrated by ability to move thumb fully through opposition, abduction and flexion. No snuffbox tenderness.   Neuro: The patient is alert and oriented to person, place, and time, appropriately conversive, with 5/5 bilat UE/LE strength, no gross motor or sensory defects noted. Coordination appears to be adequate. Skin: Warm, dry, and intact Psych: normal mood and affect, no SI or HI  ED Results / Procedures / Treatments   Labs (all labs ordered are listed, but only abnormal results are displayed) Labs Reviewed - No data to display   EKG   RADIOLOGY CT head: No  intracranial hemorrhage on my independent review interpretation radiologist agrees CT C-spine: No fracture however patient does have a large thyroid nodule and patient was made aware of this result X-ray of left humerus: Obvious fracture of the surgical neck of the left humerus and radiologist reads this as: Impacted, comminuted fracture of the surgical neck of the left humerus with posterior displacement of the greater tuberosity.      PROCEDURES:  Critical Care performed: No  Procedures   MEDICATIONS ORDERED IN ED: Medications  ondansetron  (ZOFRAN -ODT) disintegrating tablet 4 mg (4 mg Oral Given 09/05/24 1530)  HYDROcodone -acetaminophen  (NORCO/VICODIN) 5-325 MG per tablet 1 tablet (1 tablet Oral Given 09/05/24 1530)  HYDROcodone -acetaminophen  (NORCO/VICODIN) 5-325 MG per tablet 1 tablet (1 tablet Oral Given 09/05/24 1724)  gabapentin  (NEURONTIN ) capsule 300 mg (300 mg Oral Given 09/05/24 1724)     IMPRESSION / MDM / ASSESSMENT AND PLAN / ED COURSE                                Differential diagnosis includes, but is not limited to, intracranial hemorrhage, cervical fracture, humerus fracture, clavicle fracture   ED course: Patient presents with obvious evidence of trauma but reassured that she has no focal neurological deficits.  She was given pain control in the form of Percocet and placed in a sling.  She is neurovascularly intact in the left upper extremity.  X-ray of this did demonstrated is an impacted and comminuted fracture of the surgical neck of the left humerus.  She is advised that she will need to follow-up with orthopedics on Monday.  She was counseled that she can remove the sling for hygiene.  She CT head demonstrated no intracranial hemorrhage.  CT C-spine demonstrated no fracture but incidentally had a large thyroid nodule.  This was printed out and handed to the patient.  She was counseled that she will need an outpatient ultrasound.  Son at bedside and he will  help the patient over the next several days as she is at increased fall risks with this fracture and also with being discharged on opioids.  All questions answered and patient and son voiced understanding and requested discharge   Clinical Course as of 09/05/24 2045  Fri Sep 05, 2024  1657 CT Cervical Spine Wo Contrast Patient does have a large thyroid nodule that will need to be followed up.  I have counseled the patient and her son at bedside [HD]    Clinical Course User Index [HD] Nicholaus Rolland BRAVO, MD   At time of discharge there is no evidence of acute life, limb, vision, or fertility  threat. Patient has stable vital signs, pain is well controlled  and p.o. tolerant.  Discharge instructions were completed using the EPIC system. I would refer you to those at this time. All warnings prescriptions follow-up etc. were discussed in detail with the patient. Patient indicates understanding and is agreeable with this plan. All questions answered.  Patient is made aware that they may return to the emergency department for any worsening or new condition or for any other emergency.  -- Risk: 5 This patient has a high risk of morbidity due to further diagnostic testing or treatment. Rationale: This patient's evaluation and management involve a high risk of morbidity due to the potential severity of presenting symptoms, need for diagnostic testing, and/or initiation of treatment that may require close monitoring. The differential includes conditions with potential for significant deterioration or requiring escalation of care. Treatment decisions in the ED, including medication administration, procedural interventions, or disposition planning, reflect this level of risk. COPA: 5 The patient has the following acute or chronic illness/injury that poses a possible threat to life or bodily function: [X] : The patient has a potentially serious acute condition or an acute exacerbation of a chronic illness  requiring urgent evaluation and management in the Emergency Department. The clinical presentation necessitates immediate consideration of life-threatening or function-threatening diagnoses, even if they are ultimately ruled out.   FINAL CLINICAL IMPRESSION(S) / ED DIAGNOSES   Final diagnoses:  Fall, initial encounter  Other closed displaced fracture of proximal end of left humerus, initial encounter  Thyroid nodule     Rx / DC Orders   ED Discharge Orders          Ordered    HYDROcodone -Acetaminophen  5-300 MG TABS  4 times daily PRN        09/05/24 1703    ondansetron  (ZOFRAN -ODT) 4 MG disintegrating tablet  Every 8 hours PRN        09/05/24 1703             Note:  This document was prepared using Dragon voice recognition software and may include unintentional dictation errors.   Nicholaus Rolland BRAVO, MD 09/05/24 2045
